# Patient Record
Sex: Female | Born: 1960 | Race: White | Hispanic: No | Marital: Married | Smoking: Never smoker
Health system: Southern US, Community
[De-identification: ages and names within clinical notes are randomized; demographics above are authoritative.]

## PROBLEM LIST (undated history)

## (undated) DIAGNOSIS — Z87442 Personal history of urinary calculi: Secondary | ICD-10-CM

## (undated) DIAGNOSIS — E079 Disorder of thyroid, unspecified: Secondary | ICD-10-CM

## (undated) DIAGNOSIS — K589 Irritable bowel syndrome without diarrhea: Secondary | ICD-10-CM

## (undated) DIAGNOSIS — C641 Malignant neoplasm of right kidney, except renal pelvis: Secondary | ICD-10-CM

## (undated) DIAGNOSIS — E559 Vitamin D deficiency, unspecified: Secondary | ICD-10-CM

## (undated) DIAGNOSIS — E041 Nontoxic single thyroid nodule: Secondary | ICD-10-CM

## (undated) DIAGNOSIS — E7800 Pure hypercholesterolemia, unspecified: Secondary | ICD-10-CM

## (undated) DIAGNOSIS — Z86010 Personal history of colonic polyps: Secondary | ICD-10-CM

## (undated) DIAGNOSIS — E039 Hypothyroidism, unspecified: Secondary | ICD-10-CM

## (undated) HISTORY — DX: Personal history of colonic polyps: Z86.010

## (undated) HISTORY — DX: Irritable bowel syndrome, unspecified: K58.9

## (undated) HISTORY — PX: ANKLE HARDWARE REMOVAL: SHX1149

## (undated) HISTORY — PX: LASIK: SHX215

## (undated) HISTORY — PX: COLONOSCOPY: SHX174

## (undated) HISTORY — DX: Disorder of thyroid, unspecified: E07.9

## (undated) HISTORY — PX: BIOPSY THYROID: PRO38

## (undated) HISTORY — PX: ANKLE FRACTURE SURGERY: SHX122

## (undated) HISTORY — DX: Nontoxic single thyroid nodule: E04.1

---
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
## 2013-06-01 DIAGNOSIS — Z86010 Personal history of colonic polyps: Secondary | ICD-10-CM

## 2013-06-01 DIAGNOSIS — Z860101 Personal history of adenomatous and serrated colon polyps: Secondary | ICD-10-CM

## 2013-06-01 HISTORY — DX: Personal history of colonic polyps: Z86.010

## 2013-06-01 HISTORY — DX: Personal history of adenomatous and serrated colon polyps: Z86.0101

## 2016-01-21 ENCOUNTER — Other Ambulatory Visit: Payer: Self-pay | Admitting: Physician Assistant

## 2016-01-21 ENCOUNTER — Inpatient Hospital Stay
Admission: RE | Admit: 2016-01-21 | Discharge: 2016-01-21 | Disposition: A | Discharge status: Home / Self Care | Payer: Self-pay | Source: Ambulatory Visit | Attending: Physician Assistant | Admitting: Physician Assistant

## 2016-01-21 DIAGNOSIS — E042 Nontoxic multinodular goiter: Secondary | ICD-10-CM

## 2016-01-27 ENCOUNTER — Inpatient Hospital Stay
Admission: RE | Admit: 2016-01-27 | Discharge: 2016-01-27 | Disposition: A | Discharge status: Home / Self Care | Payer: Self-pay | Source: Ambulatory Visit | Attending: Physician Assistant | Admitting: Physician Assistant

## 2016-01-27 ENCOUNTER — Other Ambulatory Visit: Payer: Self-pay | Admitting: Physician Assistant

## 2016-01-27 DIAGNOSIS — E042 Nontoxic multinodular goiter: Secondary | ICD-10-CM

## 2016-02-06 ENCOUNTER — Ambulatory Visit
Admission: RE | Admit: 2016-02-06 | Discharge: 2016-02-06 | Disposition: A | Discharge status: Home / Self Care | Payer: Managed Care, Other (non HMO) | Source: Ambulatory Visit | Attending: Physician Assistant | Admitting: Physician Assistant

## 2016-02-06 ENCOUNTER — Other Ambulatory Visit (HOSPITAL_COMMUNITY)
Admission: RE | Admit: 2016-02-06 | Discharge: 2016-02-06 | Disposition: A | Discharge status: Home / Self Care | Payer: Managed Care, Other (non HMO) | Source: Ambulatory Visit | Attending: Radiology | Admitting: Radiology

## 2016-02-06 DIAGNOSIS — E042 Nontoxic multinodular goiter: Secondary | ICD-10-CM | POA: Diagnosis present

## 2017-03-05 ENCOUNTER — Encounter: Payer: Self-pay | Admitting: Family Medicine

## 2017-03-05 ENCOUNTER — Ambulatory Visit (INDEPENDENT_AMBULATORY_CARE_PROVIDER_SITE_OTHER): Payer: Managed Care, Other (non HMO) | Admitting: Family Medicine

## 2017-03-05 VITALS — BP 130/82 | HR 79 | Temp 98.8°F | Resp 16 | Ht 67.5 in | Wt 185.6 lb

## 2017-03-05 DIAGNOSIS — Z13220 Encounter for screening for lipoid disorders: Secondary | ICD-10-CM | POA: Diagnosis not present

## 2017-03-05 DIAGNOSIS — E038 Other specified hypothyroidism: Secondary | ICD-10-CM | POA: Diagnosis not present

## 2017-03-05 DIAGNOSIS — E063 Autoimmune thyroiditis: Secondary | ICD-10-CM | POA: Diagnosis not present

## 2017-03-05 DIAGNOSIS — E041 Nontoxic single thyroid nodule: Secondary | ICD-10-CM | POA: Diagnosis not present

## 2017-03-05 DIAGNOSIS — Z1231 Encounter for screening mammogram for malignant neoplasm of breast: Secondary | ICD-10-CM | POA: Diagnosis not present

## 2017-03-05 DIAGNOSIS — E7800 Pure hypercholesterolemia, unspecified: Secondary | ICD-10-CM

## 2017-03-05 DIAGNOSIS — Z131 Encounter for screening for diabetes mellitus: Secondary | ICD-10-CM

## 2017-03-05 DIAGNOSIS — E559 Vitamin D deficiency, unspecified: Secondary | ICD-10-CM | POA: Diagnosis not present

## 2017-03-05 DIAGNOSIS — Z1239 Encounter for other screening for malignant neoplasm of breast: Secondary | ICD-10-CM

## 2017-03-05 MED ORDER — LEVOTHYROXINE SODIUM 25 MCG PO TABS: 25.0000 ug | ORAL_TABLET | Freq: Every day | ORAL | 3 refills | Status: DC

## 2017-03-05 NOTE — Patient Instructions (Addendum)
Iver Nestle, nutritionist at [facility] on [address]   We recommend that you schedule a mammogram for breast cancer screening. Typically, you do not need a referral to do this. Please contact a local imaging center to schedule your mammogram.  [facility] - [phone]  *ask for the Radiology Department [facility] ([place]) - [phone] or [phone]  [facility] - [phone] [place] [phone] [facility] - [phone]  *ask for the [facility] - [phone]  *ask for the Radiology Department [facility] - [phone]  *ask for the [place] - [phone] IF you received an x-ray today, you will receive an invoice from [facility]. Please contact [facility] at [phone] with questions or concerns regarding your invoice.   IF you received labwork today, you will receive an invoice from [place]. Please contact [facility] at [phone] with questions or concerns regarding your invoice.   Our billing staff will not be able to assist you with questions regarding bills from these companies.  You will be contacted with the lab results as soon as they are available. The fastest way to get your results is to activate your My Chart account. Instructions are located on the last page of this paperwork. If you have not heard from [place] regarding the results in 2 weeks, please contact this office.

## 2017-03-05 NOTE — Progress Notes (Signed)
Subjective:    Patient ID: Ann Miller, female    DOB: 04-May-1961, 56 y.o.   MRN: [id number]  03/05/2017  thyroid (new pt, moved from [place])   HPI This 56 y.o. female presents for evaluation of hypothyroidism and to establish care.    Last physical:  12-2015 Pap smear:12-2013; 04-16-16 LMP. Mammogram:  2017 Colonoscopy: not usre; five year plan; due 05/2018 Eye exam:  yearly Dental exam:  Every six months.  Hypothyroidism:  Patient reports good compliance with medication, good tolerance to medication, and good symptom control.  Started taking medication 2013.  Cold intoleranace, fatigue.  Malaise.    Thyroid nodule: s/p FNA last year.  Due for repeat ultrasound overdue.  Bone density scan performed. Vitamin D low in the past. Thyroid ab positive for years.    There is no immunization history on file for this patient. BP Readings from Last 3 Encounters:  03/05/17 130/82   Wt Readings from Last 3 Encounters:  03/05/17 185 lb 9.6 oz (84.2 kg)    Review of Systems  Constitutional: Negative for chills, diaphoresis, fatigue and fever.  Eyes: Negative for visual disturbance.  Respiratory: Negative for cough and shortness of breath.   Cardiovascular: Negative for chest pain, palpitations and leg swelling.  Gastrointestinal: Negative for abdominal pain, constipation, diarrhea, nausea and vomiting.  Endocrine: Negative for cold intolerance, heat intolerance, polydipsia, polyphagia and polyuria.  Neurological: Negative for dizziness, tremors, seizures, syncope, facial asymmetry, speech difficulty, weakness, light-headedness, numbness and headaches.    Past Medical History:  Diagnosis Date  . Thyroid disease    Past Surgical History:  Procedure Laterality Date  . ANKLE FRACTURE SURGERY Left    Allergies  Allergen Reactions  . Codeine    Current Outpatient Prescriptions  Medication Sig Dispense Refill  . levothyroxine (SYNTHROID, LEVOTHROID) 25 MCG tablet Take 25 mcg by  mouth daily before breakfast.     No current facility-administered medications for this visit.    Social History   Social History  . Marital status: Married    Spouse name: N/A  . Number of children: N/A  . Years of education: N/A   Occupational History  . Not on file.   Social History Main Topics  . Smoking status: Never Smoker  . Smokeless tobacco: Never Used  . Alcohol use Yes     Comment: 2 drinks a month  . Drug use: No  . Sexual activity: Not on file   Other Topics Concern  . Not on file   Social History Narrative   Marital status: married x 32 years; happily; no abuse; Moved from [place] in 2016      Children: (25, 23, 18); no grandchildren     Lives: with husband, youngest child.        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 Diabetes Mother   . Hyperlipidemia Mother   . Hyperlipidemia Father   . Diabetes Maternal Grandmother   . Diabetes Maternal Grandfather   . Cancer Paternal Grandmother   . Cancer Paternal Grandfather        Objective:    BP 130/82   Pulse 79   Temp 98.8 F (37.1 C) (Oral)   Resp 16   Ht 5' 7.5" (1.715 m)   Wt 185  lb 9.6 oz (84.2 kg)   LMP 04/16/2016 Comment: postmenopausal  SpO2 96%   BMI 28.64 kg/m  Physical Exam  Constitutional: She is oriented to person, place, and time. She appears well-developed and well-nourished. No distress.  HENT:  Head: Normocephalic and atraumatic.  Right Ear: External ear normal.  Left Ear: External ear normal.  Nose: Nose normal.  Mouth/Throat: Oropharynx is clear and moist.  Eyes: Conjunctivae and EOM are normal. Pupils are equal, round, and reactive to light.  Neck: Normal range of motion. Neck supple. Carotid bruit is not present. Thyromegaly present.  Cardiovascular: Normal rate, regular rhythm, normal heart sounds and  intact distal pulses.  Exam reveals no gallop and no friction rub.   No murmur heard. Pulmonary/Chest: Effort normal and breath sounds normal. She has no wheezes. She has no rales.  Abdominal: Soft. Bowel sounds are normal. She exhibits no distension and no mass. There is no tenderness. There is no rebound and no guarding.  Lymphadenopathy:    She has no cervical adenopathy.  Neurological: She is alert and oriented to person, place, and time. She has normal reflexes. No cranial nerve deficit. She exhibits normal muscle tone. Coordination normal.  Skin: Skin is warm and dry. No rash noted. She is not diaphoretic. No erythema. No pallor.  Psychiatric: She has a normal mood and affect. Her behavior is normal.   No results found for this or any previous visit.      Assessment & Plan:   1. Hypothyroidism due to Hashimoto's thyroiditis   2. Thyroid nodule   3. Vitamin D deficiency   4. Screening for diabetes mellitus   5. Screening, lipid    -patient established care today and medical history reviewed and updated. -obtain labs -refer for repeat thyroid US.  S/p biopsy of thyroid nodule with pathology consistent with benign follicular nodule. -refill of Levothyroxine provided today. -screening labs also obtained in anticipation of upcoming CPE.   Orders Placed This Encounter  Procedures  . US THYROID    Standing Status:   Future    Standing Expiration Date:   05/05/2018    Order Specific Question:   Reason for Exam (SYMPTOM  OR DIAGNOSIS REQUIRED)    Answer:   follow=up of R thyroid nodule at isthmus    Order Specific Question:   Preferred imaging location?    Answer:   [facility]  . CBC with Differential/Platelet  . VITAMIN D 25 Hydroxy (Vit-D Deficiency, Fractures)  . Comprehensive metabolic panel    Order Specific Question:   Has the patient fasted?    Answer:   Yes  . TSH  . T4, free  . Hemoglobin A1c  . Lipid panel    Order Specific Question:   Has the patient  fasted?    Answer:   Yes   Meds ordered this encounter  Medications  . levothyroxine (SYNTHROID, LEVOTHROID) 25 MCG tablet    Sig: Take 25 mcg by mouth daily before breakfast.    No Follow-up on file.   Xadrian Craighead Elayne Guerin, M.D. Primary Care at [facility] [address] [place], [place]  [address] [phone] phone [phone] fax

## 2017-03-06 LAB — LIPID PANEL
CHOL/HDL RATIO: 5.1 ratio — AB (ref 0.0–4.4)
CHOLESTEROL TOTAL: 215 mg/dL — AB (ref 100–199)
HDL: 42 mg/dL (ref >39)
LDL CALC: 149 mg/dL — AB (ref 0–99)
Triglycerides: 120 mg/dL (ref 0–149)
VLDL CHOLESTEROL CAL: 24 mg/dL (ref 5–40)

## 2017-03-06 LAB — CBC WITH DIFFERENTIAL/PLATELET
Basophils Absolute: 0 10*3/uL (ref 0.0–0.2)
Basos: 1 %
EOS (ABSOLUTE): 0.2 10*3/uL (ref 0.0–0.4)
Eos: 3 %
Hematocrit: 40.4 % (ref 34.0–46.6)
Hemoglobin: 13.6 g/dL (ref 11.1–15.9)
Immature Grans (Abs): 0 10*3/uL (ref 0.0–0.1)
Immature Granulocytes: 0 %
LYMPHS ABS: 1.9 10*3/uL (ref 0.7–3.1)
Lymphs: 35 %
MCH: 26.8 pg (ref 26.6–33.0)
MCHC: 33.7 g/dL (ref 31.5–35.7)
MCV: 80 fL (ref 79–97)
MONOS ABS: 0.5 10*3/uL (ref 0.1–0.9)
Monocytes: 8 %
Neutrophils Absolute: 2.9 10*3/uL (ref 1.4–7.0)
Neutrophils: 53 %
PLATELETS: 269 10*3/uL (ref 150–379)
RBC: 5.07 x10E6/uL (ref 3.77–5.28)
RDW: 13.9 % (ref 12.3–15.4)
WBC: 5.5 10*3/uL (ref 3.4–10.8)

## 2017-03-06 LAB — COMPREHENSIVE METABOLIC PANEL
ALK PHOS: 112 IU/L (ref 39–117)
ALT: 15 IU/L (ref 0–32)
AST: 20 IU/L (ref 0–40)
Albumin/Globulin Ratio: 1.7 (ref 1.2–2.2)
Albumin: 4.4 g/dL (ref 3.5–5.5)
BILIRUBIN TOTAL: 0.7 mg/dL (ref 0.0–1.2)
BUN / CREAT RATIO: 13 (ref 9–23)
BUN: 11 mg/dL (ref 6–24)
CHLORIDE: 103 mmol/L (ref 96–106)
CO2: 24 mmol/L (ref 18–29)
CREATININE: 0.84 mg/dL (ref 0.57–1.00)
Calcium: 9.7 mg/dL (ref 8.7–10.2)
GFR calc Af Amer: 91 mL/min/{1.73_m2} (ref >59)
GFR calc non Af Amer: 79 mL/min/{1.73_m2} (ref >59)
GLOBULIN, TOTAL: 2.6 g/dL (ref 1.5–4.5)
Glucose: 92 mg/dL (ref 65–99)
POTASSIUM: 4.2 mmol/L (ref 3.5–5.2)
SODIUM: 141 mmol/L (ref 134–144)
Total Protein: 7 g/dL (ref 6.0–8.5)

## 2017-03-06 LAB — TSH: TSH: 3.19 u[IU]/mL (ref 0.450–4.500)

## 2017-03-06 LAB — HEMOGLOBIN A1C
Est. average glucose Bld gHb Est-mCnc: 111 mg/dL
Hgb A1c MFr Bld: 5.5 % (ref 4.8–5.6)

## 2017-03-06 LAB — VITAMIN D 25 HYDROXY (VIT D DEFICIENCY, FRACTURES): Vit D, 25-Hydroxy: 31.7 ng/mL (ref 30.0–100.0)

## 2017-03-06 LAB — T4, FREE: FREE T4: 1.13 ng/dL (ref 0.82–1.77)

## 2017-03-09 ENCOUNTER — Ambulatory Visit (INDEPENDENT_AMBULATORY_CARE_PROVIDER_SITE_OTHER): Payer: Managed Care, Other (non HMO) | Admitting: Physician Assistant

## 2017-03-09 ENCOUNTER — Encounter: Payer: Self-pay | Admitting: Family Medicine

## 2017-03-09 ENCOUNTER — Encounter: Payer: Self-pay | Admitting: Physician Assistant

## 2017-03-09 VITALS — BP 134/81 | HR 74 | Temp 98.1°F | Resp 18 | Ht 67.44 in | Wt 188.2 lb

## 2017-03-09 DIAGNOSIS — B309 Viral conjunctivitis, unspecified: Secondary | ICD-10-CM | POA: Diagnosis not present

## 2017-03-09 MED ORDER — NAPHAZOLINE-PHENIRAMINE 0.025-0.3 % OP SOLN: 1.0000 [drp] | Freq: Four times a day (QID) | OPHTHALMIC | 0 refills | Status: DC | PRN

## 2017-03-09 NOTE — Patient Instructions (Addendum)
Use eye drops as prescribed. This will help with the redness and the itching. Please also use a warm compress to the eye throughout the day. Call our office or return to clinic if symptoms worsen (i.e., you start to have worsening vision, develop any pain, or purulent discharge), do not improve in 5- 7 days, or as needed    Viral Conjunctivitis, Adult Viral conjunctivitis is an inflammation of the clear membrane that covers the white part of your eye and the inner surface of your eyelid (conjunctiva). The inflammation is caused by a viral infection. The blood vessels in the conjunctiva become inflamed, causing the eye to become red or pink, and often itchy. Viral conjunctivitis can be easily passed from one person to another (is contagious). This condition is often called pink eye. What are the causes? This condition is caused by a virus. A virus is a type of contagious germ. It can be spread by touching objects that have been contaminated with the virus, such as doorknobs or towels. It can also be passed through droplets, such as from coughing or sneezing. What are the signs or symptoms? Symptoms of this condition include:  Eye redness.  Tearing or watery eyes.  Itchy and irritated eyes.  Burning feeling in the eyes.  Clear drainage from the eye.  Swollen eyelids.  A gritty feeling in the eye.  Light sensitivity.  This condition often occurs with other symptoms, such as a fever, nausea, or a rash. How is this diagnosed? This condition is diagnosed with a medical history and physical exam. If you have discharge from your eye, the discharge may be tested to rule out other causes of conjunctivitis. How is this treated? Viral conjunctivitis does not respond to medicines that kill bacteria (antibiotics). Treatment for viral conjunctivitis is directed at stopping a bacterial infection from developing in addition to the viral infection. Treatment also aims to relieve your symptoms, such as  itching. This may be done with antihistamine drops or other eye medicines. Rarely, steroid eye drops or antiviral medicines may be prescribed. Follow these instructions at home: Medicines   Take or apply over-the-counter and prescription medicines only as told by your health care provider.  Be very careful to avoid touching the edge of the eyelid with the eye drop bottle or ointment tube when applying medicines to the affected eye. Being careful this way will stop you from spreading the infection to the other eye or to other people. Eye care  Avoid touching or rubbing your eyes.  Apply a warm, wet, clean washcloth to your eye for 10-20 minutes, 3-4 times per day or as told by your health care provider.  If you wear contact lenses, do not wear them until the inflammation is gone and your health care provider says it is safe to wear them again. Ask your health care provider how to sterilize or replace your contact lenses before using them again. Wear glasses until you can resume wearing contacts.  Avoid wearing eye makeup until the inflammation is gone. Throw away any old eye cosmetics that may be contaminated.  Gently wipe away any drainage from your eye with a warm, wet washcloth or a cotton ball. General instructions  Change or wash your pillowcase every day or as told by your health care provider.  Do not share towels, pillowcases, washcloths, eye makeup, makeup brushes, contact lenses, or glasses. This may spread the infection.  Wash your hands often with soap and water. Use paper towels to dry your  hands. If soap and water are not available, use hand sanitizer.  Try to avoid contact with other people for one week or as told by your health care provider. Contact a health care provider if:  Your symptoms do not improve with treatment or they get worse.  You have increased pain.  Your vision becomes blurry.  You have a fever.  You have facial pain, redness, or swelling.  You  have yellow or green drainage coming from your eye.  You have new symptoms. This information is not intended to replace advice given to you by your health care provider. Make sure you discuss any questions you have with your health care provider. Document Released: 12/05/2002 Document Revised: 04/11/2016 Document Reviewed: 03/31/2016 Elsevier Interactive Patient Education  2017 [employer].  IF you received an x-ray today, you will receive an invoice from [facility]. Please contact [facility] at [phone] with questions or concerns regarding your invoice.   IF you received labwork today, you will receive an invoice from [place]. Please contact [facility] at [phone] with questions or concerns regarding your invoice.   Our billing staff will not be able to assist you with questions regarding bills from these companies.  You will be contacted with the lab results as soon as they are available. The fastest way to get your results is to activate your My Chart account. Instructions are located on the last page of this paperwork. If you have not heard from [place] regarding the results in 2 weeks, please contact this office.     We recommend that you schedule a mammogram for breast cancer screening. Typically, you do not need a referral to do this. Please contact a local imaging center to schedule your mammogram.  [facility] - [phone]  *ask for the Radiology Department [facility] ([place]) - [phone] or [phone]  [facility] - [phone] [place] [phone] [facility] - [phone]  *ask for the [facility] - [phone]  *ask for the Radiology Department [facility] - [phone]  *ask for the [place] - [phone]

## 2017-03-09 NOTE — Progress Notes (Addendum)
Ann Miller  MRN: [id number] DOB: 09-11-1961  Subjective:  Ann Miller is a 56 y.o. female seen in office today for a chief complaint of left itchy eye x 1 day. Has associated redness and watery discharge. Denies eye pain, photophobia, foreign body sensation, visual disturbance, and recent illness. Denies contact lens use. Has not tried anything for relief. Has no hx of seasonal allergies.   Review of Systems  Constitutional: Negative for chills, fatigue and fever.  HENT: Negative for congestion, ear pain, rhinorrhea, sinus pain, sneezing and sore throat.     There are no active problems to display for this patient.   Current Outpatient Prescriptions on File Prior to Visit  Medication Sig Dispense Refill  . levothyroxine (SYNTHROID, LEVOTHROID) 25 MCG tablet Take 1 tablet (25 mcg total) by mouth daily before breakfast. 90 tablet 3   No current facility-administered medications on file prior to visit.     Allergies  Allergen Reactions  . Codeine      Objective:  BP 134/81 (BP Location: Right Arm, Patient Position: Sitting, Cuff Size: Normal)   Pulse 74   Temp 98.1 F (36.7 C) (Oral)   Resp 18   Ht 5' 7.44" (1.713 m)   Wt 188 lb 3.2 oz (85.4 kg)   LMP 04/16/2016 Comment: postmenopausal  SpO2 98%   BMI 29.09 kg/m   Visual Acuity Screening   Right eye Left eye Both eyes  Without correction: 20/30 20/30 20/13   With correction:       Physical Exam  Constitutional: She is oriented to person, place, and time and well-developed, well-nourished, and in no distress.  HENT:  Head: Normocephalic and atraumatic.  Right Ear: Tympanic membrane, external ear and ear canal normal.  Left Ear: Tympanic membrane, external ear and ear canal normal.  Nose: Nose normal. Right sinus exhibits no maxillary sinus tenderness and no frontal sinus tenderness. Left sinus exhibits no maxillary sinus tenderness and no frontal sinus tenderness.  Mouth/Throat: Uvula is midline, oropharynx is  clear and moist and mucous membranes are normal.  Eyes: Left eye visual fields normal. EOM are normal. Pupils are equal, round, and reactive to light. Left eye exhibits discharge (moderate amount of watery discharge). Left eye exhibits no hordeolum. Left conjunctiva is injected (mildly).  Fundoscopic exam:      The right eye shows no AV nicking, no exudate and no papilledema. The right eye shows red reflex.       The left eye shows no AV nicking, no exudate and no papilledema. The left eye shows red reflex.  No pain with palpation of the orbits bilaterally. No pain with penlight examination.   Neck: Normal range of motion.  Pulmonary/Chest: Effort normal.  Lymphadenopathy:       Head (right side): No submental, no submandibular, no tonsillar, no preauricular, no posterior auricular and no occipital adenopathy present.       Head (left side): No submental, no submandibular, no tonsillar, no preauricular and no occipital adenopathy present.    She has no cervical adenopathy.       Right: No supraclavicular adenopathy present.       Left: No supraclavicular adenopathy present.  Neurological: She is alert and oriented to person, place, and time. Gait normal.  Skin: Skin is warm and dry.  Psychiatric: Affect normal.  Vitals reviewed.   Assessment and Plan :  1. Viral conjunctivitis of left eye Hx of PE findings consistent with viral conjunctivitis. No red flags noted. Will treat symptomatically. Pt  instructed to return to clinic if symptoms worsen, do not improve in 5-7 days, or as needed - naphazoline-pheniramine (NAPHCON-A) 0.025-0.3 % ophthalmic solution; Place 1 drop into the left eye 4 (four) times daily as needed for irritation.  Dispense: 15 mL; Refill: 0   Tenna Delaine, PA-C  Primary Care at [facility] 03/09/2017 1:48 PM

## 2017-03-22 ENCOUNTER — Ambulatory Visit
Admission: RE | Admit: 2017-03-22 | Discharge: 2017-03-22 | Disposition: A | Discharge status: Home / Self Care | Payer: Managed Care, Other (non HMO) | Source: Ambulatory Visit | Attending: Family Medicine | Admitting: Family Medicine

## 2017-03-22 DIAGNOSIS — E063 Autoimmune thyroiditis: Principal | ICD-10-CM

## 2017-03-22 DIAGNOSIS — E038 Other specified hypothyroidism: Secondary | ICD-10-CM

## 2017-03-22 DIAGNOSIS — E041 Nontoxic single thyroid nodule: Secondary | ICD-10-CM

## 2017-03-22 MED ORDER — LEVOTHYROXINE SODIUM 50 MCG PO TABS: 50.0000 ug | ORAL_TABLET | Freq: Every day | ORAL | 3 refills | Status: DC

## 2017-03-22 NOTE — Addendum Note (Signed)
Addended by: Wardell Honour on: 03/22/2017 11:23 AM   Modules accepted: Orders

## 2017-03-25 ENCOUNTER — Ambulatory Visit
Admission: RE | Admit: 2017-03-25 | Discharge: 2017-03-25 | Disposition: A | Discharge status: Home / Self Care | Payer: Managed Care, Other (non HMO) | Source: Ambulatory Visit | Attending: Family Medicine | Admitting: Family Medicine

## 2017-03-25 DIAGNOSIS — Z1239 Encounter for other screening for malignant neoplasm of breast: Secondary | ICD-10-CM

## 2017-03-27 ENCOUNTER — Other Ambulatory Visit: Payer: Self-pay | Admitting: Family Medicine

## 2017-03-27 DIAGNOSIS — E042 Nontoxic multinodular goiter: Secondary | ICD-10-CM

## 2017-03-29 ENCOUNTER — Telehealth: Payer: Self-pay | Admitting: Family Medicine

## 2017-03-29 DIAGNOSIS — E041 Nontoxic single thyroid nodule: Secondary | ICD-10-CM

## 2017-03-29 NOTE — Telephone Encounter (Signed)
[facility] called to request a second duplicate US Thyroid biopsy order be placed since they are looking at both nodules on pt.

## 2017-03-30 NOTE — Telephone Encounter (Signed)
Please place orders

## 2017-04-05 NOTE — Telephone Encounter (Signed)
[facility] advised

## 2017-04-05 NOTE — Telephone Encounter (Signed)
Completed/order placed.

## 2017-04-22 ENCOUNTER — Encounter: Payer: Self-pay | Admitting: Family Medicine

## 2017-04-22 ENCOUNTER — Ambulatory Visit (INDEPENDENT_AMBULATORY_CARE_PROVIDER_SITE_OTHER): Payer: Managed Care, Other (non HMO) | Admitting: Family Medicine

## 2017-04-22 DIAGNOSIS — Z713 Dietary counseling and surveillance: Secondary | ICD-10-CM | POA: Diagnosis not present

## 2017-04-22 NOTE — Patient Instructions (Addendum)
-   Keep a symptoms log, including what you eat, how much, and what time, as well as symptoms and time of onset.  IF you cannot determine any pattern or specific foods that cause problems, you may want to try a FODMAPS elimination diet to try to pinpoint triggering foods.  We can talk about that at follow-up, if you choose.   - VSL#3 is a probiotic that has a lot of good data behind it for use with IBS.  ([facility] at [facility] and at [facility]; call ahead.)  - Soy is the only dairy alternative that provides decent protein.   - You will get better absorption of your calcium if you split up your Viactiv chews to twice a day vs. taking all at once.    Goals: - Eat at least 3 REAL meals and 1-2 snacks per day.  Aim for no more than 5 hours between eating.  Eat breakfast within one hour of getting up.  Consider what foods you may be able to tolerate in small quantities for breakfast, e.g., fruit or handful of nuts.   A REAL meal includes at least some protein, some starch, and vegetables and/or fruit.    - Lower glycemic foods (don't raise blood glucose as much) are those that are NOT highly processed, e.g., cooked whole grains vs. Ready to eat cereal or bread OR foods that nature prepared (exception: white potato is high-glycemic.)   - [place].com includes a lot of info on glycemic index and glycemic load.   - Obtain twice as many veg's as protein or carbohydrate foods for both lunch and dinner.  - Be cautious about what vegetables you use, and be careful with those that cause problems.    - AT YOUR FOLLOW-UP VISIT, WE CAN TALK ABOUT WAYS TO MANAGE CHOLESTEROL LEVELS AND BLOOD GLUCOSE.  And we'll follow up on probiotic use, and dietary ways you may be able to enhance your gut microbes.

## 2017-04-22 NOTE — Progress Notes (Signed)
Medical Nutrition Therapy:  Appt start time: 1330 end time:  1430. PCP: Reginia Forts, MD  Assessment:  Primary concerns today: Weight management.  Evynn was referred by Joycelyn Rua for preventive nutrition counseling.  She moved here from [place] 2 yrs ago, and has not been working since then, so now has time to devote to better self-care.  She and her husband have an 18-YO at home, who will be going to [employer] in the fall, and 2 older children not living at home.  She wants to understand the best way for her to eat in light of menopause, cholesterol level rising recently, and family hx of DM.  Lab's on 03/05/17: Cholest 215; LDL 149; TG 120; HDL 42.   She is frustrated that she feels she's been eating well and exercising more, but not losing weight nor improving lipids.  Would love to weigh less than 150.  Yittel feels especially drawn to sweets in the evenings, usually has a dessert food 4-5 X wk.  When stressed she tends to not eat, and can even lose weight during stressful times.    Stefhanie has had digestive problems most of her adult life (IBS dx years ago), including frequent diarrhea for which she started taking a probiotic called Probiotic 12 by [employer] last March.    I suspect Caroleen is actually under-eating, both energy and protein, in light of her current eating and exercise patterns.  She tends to eat mostly vegetarian, and avoids most dairy b/c of digestive problems.    Learning Readiness: Ready  Usual eating pattern includes 2-3 meals and 2 snacks per day. Frequent foods and beverages include water, Shakeology pro powder, leafy greens, 8 oz almond milk, 1/2 banana, ice; leafy greens, Hello Fresh Meals, Blue Apron, Purple Carrot (vegan) prepared meals; garbanzos, quinoa, swt potatoes, fruit, green/black tea.  Avoided foods include most soda, etOH, chips, fast foods, cereal.  Has been eating mostly vegetarian meals plus fish and eggs.   Usual physical activity includes 60-min yoga 2-3 X wk,  60-min pilates 2-3 X wk, 45-min [place] [employer]em[employer] in [employer]) 2-3 X wk.  Usually exercises 5-6 X wk.    24-hr recall: (Up at 5:30; up at 9 AM; levothyroxine first thing, probiotic 1 hr later) B ( AM)-   Few sips water Snk ( AM)-   Few sips water L (11:30 PM)-  Shakeology pro powder, leafy greens, 8 oz almond milk, 1/2 banana, ice Snk (4 PM)-  2/3 c cashews, water D (8 PM)-  McD's 6 chx nuggets, med fries, 12 oz soda Snk (9 PM)-  1 freeze pop Typical day? No.  Seldom has fast food or soda. Dinner is usually a little earlier, but no one was home yesterday.    Progress Towards Goal(s):  In progress.   Nutritional Diagnosis:  NI-1.4 Inadequate energy intake As related to meals, in light of usual energy expenditure.  As evidenced by eating pattern of only 2 meals/day with tendency to snack in evenings on foods she would like to be avoiding.    Intervention:  Nutrition education  Handouts given during visit include:  AVS  Demonstrated degree of understanding via:  Teach Back   Barriers to learning/adherence to lifestyle change: attraction to sweets  Monitoring/Evaluation:  Dietary intake, exercise, and body weight in 4 week(s).

## 2017-04-28 ENCOUNTER — Ambulatory Visit
Admission: RE | Admit: 2017-04-28 | Discharge: 2017-04-28 | Disposition: A | Discharge status: Home / Self Care | Payer: Managed Care, Other (non HMO) | Source: Ambulatory Visit | Attending: Family Medicine | Admitting: Family Medicine

## 2017-04-28 ENCOUNTER — Other Ambulatory Visit (HOSPITAL_COMMUNITY)
Admission: RE | Admit: 2017-04-28 | Discharge: 2017-04-28 | Disposition: A | Discharge status: Home / Self Care | Payer: Managed Care, Other (non HMO) | Source: Ambulatory Visit | Attending: General Surgery | Admitting: General Surgery

## 2017-04-28 DIAGNOSIS — E041 Nontoxic single thyroid nodule: Secondary | ICD-10-CM | POA: Insufficient documentation

## 2017-04-28 DIAGNOSIS — E042 Nontoxic multinodular goiter: Secondary | ICD-10-CM

## 2017-05-20 ENCOUNTER — Encounter: Payer: Self-pay | Admitting: Family Medicine

## 2017-05-20 ENCOUNTER — Ambulatory Visit (INDEPENDENT_AMBULATORY_CARE_PROVIDER_SITE_OTHER): Payer: Managed Care, Other (non HMO) | Admitting: Family Medicine

## 2017-05-20 DIAGNOSIS — Z713 Dietary counseling and surveillance: Secondary | ICD-10-CM

## 2017-05-20 NOTE — Patient Instructions (Addendum)
-   Protein recommended intake:  0.8 g per kg of body weight.  This equals 68 grams per day.  Recent research suggests that our protein needs increase some as we age.  A good functional number is 70 grams.  You will be able to meet this goal with obtaining 20 grams of protein at each meal.    - Keep in mind that the classic vegetarian protein source is BEANS (If you are using beans as your sole protein source, you need at least a FULL cup).  Add to salads; make bean burritos or tacos; soups/casseroles.   - B12: 2.4 mcg/day.  - Iodine: 150 mcg; talk to Dr. Tamala Julian about your iodine concerns.    - Pay attn to your sweets cravings:  What precedes your choice of eating something sweet?  (Hint: Consistently eating 3 balanced meals helps prevent sweets cravings.)  - Do am analysis of what led to that choice.  What can you put in place for that same choice to NOT happen next time?  What role did HABIT play in this choice?  - Disconnect sweets from any sense of reward.    - Limit sweets to those times you can give your snack complete attention, and savor every bite.    - Track the number of times you have something sweet.  Include: Were you distracted while eating your sweet food?  Good information about your nutrient needs:  [email or web address]

## 2017-05-20 NOTE — Progress Notes (Signed)
Medical Nutrition Therapy:  Appt start time: 1330 end time:  1430. PCP: Reginia Forts, MD  Assessment:  Primary concerns today: Weight management.  Danese has increased her protein intake, especially at breakfast.  This has been challenging for her.  She has started getting some yogurt 3-4 X week.  Her preference is still to mostly avoid meat.    She remains frustrated that she is not losing weight, and Mikhayla had numerous questions about specific nutrient needs, (e.g., iodine needs, as related to thryroid nodules?).    Ezrie has realized that cravings for sweets are pretty frequent, and that although she is not an emotional eater, she does tend to eat sweets at times of positive emotions, such as around friends and families and celebrations.  Estimates she eats sweets at least daily.  This may be impacting her weight loss ability.    Recent physical activity includes 60-min yoga 2-3 X wk, 60-min pilates 2-3 X wk, 45-min [place] (Females in [employer]) 0-2 X wk.  Usually exercises 5 X wk.    24-hr recall:  (Up at 7:30 AM; took levothyroxine) B (9 AM)-  2 eggs, olive oil, 2 toast, 1 tbsp alm butter, 2 tsp str'berry jam, water Snk ( AM)-  --- L (2:45 PM)-  3/4 c carrots, few fries, water Snk (5 PM)-  2 tbsp cashews D (8 PM)-  1/2 tortilla, 1/3 c blk beans, 1/2 oz chs, pep's, tom, water Snk (9 PM)-  1/2 brownie, water Typical day? No. Didn't eat as much as usual.  Lunch is often vegetarian dinner leftovers or skinny bagel with lox or salad with a protein source.  Estimates she gets a real lunch 4 X wk.    Progress Towards Goal(s):  In progress.   Nutritional Diagnosis: Some progress noted on NI-1.4 Inadequate energy intake As related to meals, in light of usual energy expenditure.  As evidenced by 3 meals a day at least half the days of the week, and increased protein intake, especially at breakfast.    Intervention:  Nutrition education  Handouts given during visit  include:  AVS  Demonstrated degree of understanding via:  Teach Back   Barriers to learning/adherence to lifestyle change: attraction to sweets  Monitoring/Evaluation:  Dietary intake, exercise, and body weight in 10 week(s).

## 2017-08-02 ENCOUNTER — Encounter: Payer: Self-pay | Admitting: Family Medicine

## 2017-08-02 ENCOUNTER — Ambulatory Visit (INDEPENDENT_AMBULATORY_CARE_PROVIDER_SITE_OTHER): Payer: Managed Care, Other (non HMO) | Admitting: Family Medicine

## 2017-08-02 DIAGNOSIS — Z713 Dietary counseling and surveillance: Secondary | ICD-10-CM | POA: Diagnosis not present

## 2017-08-02 NOTE — Progress Notes (Signed)
Medical Nutrition Therapy:  Appt start time: 1330 end time:  1430. PCP: Reginia Forts, MD  Assessment:  Primary concerns today: Weight management.  Jenet continues to have intermittent problems with digestion.  Foods that she knows she doesn't tolerate well include soda, especially colas; leafy greens or roasted veg's in excess; milk (can tolerate yogurt ok); cream sauces.    Landis has been eating earlier in the day, and is eating more protein at each meal, but consistency is still questionable, i.e., a typical lunch is a large salad with no protein added.  Kemper still identifies sweets as an area she feels needs work; estimates she eats some dessert-type food at least once a day on average.    Recent physical activity includes 60-min yoga 1-2 X wk, 60-min pilates 1-2 X wk, 45-min [place] ([employer]emales [employer]) 1-2 X wk.  Usually exercises 6 X wk; walks 1-2 times if can't get at least 5 classes in the week.    24-hr recall:  (Up at 9 AM; took levothyroxine) B (10:30 AM)-  5 oz [employer] Grk yogurt (15 g protein, 120 kcal), water Snk ( AM)-  --- L (12 PM)-  1/2 [employer] [place] apple fritter, 2 scrmbld eggs, water Snk (6 PM)-  water D ( PM)-  5 oz salmon, tossed salad, 1 tbsp drsng, 2 small slc bread, 1 tsp butter, water Snk ( PM)-  --- Typical day? No.  Doesn't usually have apple fritter for lunch.  More typical lunch is big salad w/ slc of bread, followed by aft snack of apple with almond butter.    Progress Towards Goal(s):  In progress.   Nutritional Diagnosis: Some progress noted on NI-1.4 Inadequate energy intake As related to meals, in light of usual energy expenditure.  As evidenced by 3 meals a day at least half the days of the week, and increased protein intake, especially at breakfast.    Intervention:  Nutrition education  Handouts given during visit include:  AVS  Demonstrated degree of understanding via:  Teach Back   Barriers to learning/adherence to lifestyle change:  attraction to sweets  Monitoring/Evaluation:  Dietary intake, exercise, and body weight in 10 week(s).  Insurance will not cover more visits until 2019.

## 2017-08-02 NOTE — Patient Instructions (Addendum)
-   You may want to consider trying digestive enzymes.  I recommend getting a product that targets all macronutrients, carb, protein, and fat.  Natural Alternatives is a reliable source.    - Consider cooking some lean red meat up to a couple times a week, then slicing it and freezing for sandwiches or for salads.  (Plan to cook leftover fish for salads.)  - DASH Diet: Main feature is LOTS of fruits and veg's, but it also includes (consistently) lean meats and protein sources, including lean dairy foods.    - Quick decoding Qs:   - When you are struggling with a food decision, ask yourself these three "decoding" questions: 1. What am I feeling right now? 2. What do I want to feel? 3. What do I truly need right now? Use the Feelings and Needs lists, and WRITE your answers.  After each question, ask yourself, "Is there anything more?" You are looking for feelings, not thoughts.  Bring your responses to your follow-up appt.    Goals: 1. Limit sweets to 4 times a week.    a. How do you define "a sweet"?  = your idea of a "normal" portion.   b. Recognize that the overall goal is to decrease intake of sugar (and empty calories).   c. Use the "quick decoding Qs" 2. Get at least 45 minutes of exercise 5 X wk.  (Record # of minutes each day.) 3. Consistency with your probiotic and any other supplements.  Limit sweets to 4 times a week.     - Complete Goals Sheet, and bring to appt in January.   - Email Jeannie no later than mid-December with a progress report.

## 2017-10-18 ENCOUNTER — Ambulatory Visit: Payer: Managed Care, Other (non HMO) | Admitting: Family Medicine

## 2017-10-25 ENCOUNTER — Ambulatory Visit (INDEPENDENT_AMBULATORY_CARE_PROVIDER_SITE_OTHER): Payer: Managed Care, Other (non HMO) | Admitting: Family Medicine

## 2017-10-25 ENCOUNTER — Encounter: Payer: Self-pay | Admitting: Family Medicine

## 2017-10-25 DIAGNOSIS — Z713 Dietary counseling and surveillance: Secondary | ICD-10-CM

## 2017-10-25 NOTE — Progress Notes (Signed)
Medical Nutrition Therapy:  Appt start time: 1330 end time:  1430. PCP: Reginia Forts, MD  Assessment:  Primary concerns today: Weight management.  Britlee has been tracking exercise and food goals on her Goals Sheet.  She has been very consistent with her supplements, and has met her sweets goal most weeks.  She has noticed it is much harder to limit sweets when away or at a party when sweets are readily available.  She has also found that eating with full attention, and practicing mindfulness has been helpful.  Chene doesn't think her cravings for sweets are emotionally related, but are more often associated with social events, needing a mental pick-me-up, or feeling bored.    Eating pattern was more consistent before the holidays, which seemed helpful to both better digestion and to limiting sweets.  Sometimes gets only two meals a day.  We discussed the benefits of consistency in meal times for overall nutrition and weight management today.    Recent physical activity still includes 60-min yoga 1-2 X wk, 60-min pilates 1-2 X wk, 45-min [place] (Females [employer]) 1-2 X wk.  Usually exercises 5 X wk; walks 1-2 times if can't get at least 5 classes in the week.  Tomia has been having some elbow pain, which has been somewhat problematic in yoga.    24-hr recall:  (Up at 9 AM) B (10 AM)-  1 c veg soup, water Snk ( AM)-  --- L ( PM)-  --- Snk (4 PM)-  1 c hot chocolate Snk ( PM)-  --- D (6 PM)-  1 roast beef & cheese sandwich, 1 c potato chips, 1/2 pomegranite, water Snk ( PM)-  --- Typical day? No.  Usually eating earlier bkfst; aft snack is usually yogurt or apple with almond butter; usually larger dinner; missing lunch ~2-3 days/wk.    Progress Towards Goal(s):  In progress.   Nutritional Diagnosis: Stable progress noted on NI-1.4 Inadequate energy intake As related to meals, in light of usual energy expenditure.  As evidenced by 3 meals a day at least half the days of the week, and  increased protein intake, especially at breakfast.    Intervention:  Nutrition education  Handouts given during visit include:  AVS  Goals Sheet, revised  Demonstrated degree of understanding via:  Teach Back   Barriers to learning/adherence to lifestyle change: attraction to sweets  Monitoring/Evaluation:  Dietary intake, exercise, and body weight prn.  Patient will call for appt after annual physical in April.

## 2017-10-25 NOTE — Patient Instructions (Addendum)
-   If your elbows continue to have pain, consider seeing someone at [place] or Charlann Boxer at [facility].    Goals: 1. Limit sweets to 4 times a week.    a. How do you define "a sweet"?  = your idea of a "normal" portion.   b. Recognize that the overall goal is to decrease intake of sugar (and empty calories).   c. Use the "quick decoding Qs" 2. Get at least 45 minutes of exercise 5 X wk.  (Record # of minutes each day.) 3. Eat at consistent meal times (roughly within one hour of getting up; lunch no later than 2 PM; dinner no later than 8; afternoon snack).  Being more consistent on your eating pattern (at least 3 meals and 1 snack/day) as well as intake of vegetables (and any fiber sources) should help your GI function.  Set your phone alarm for 1 hr after levothyroxine!   Complete your Goals Sheet, and bring to follow-up appt.    Call or email for appt in April (after your physical): [phone] / [email or web address]. (And let me know what you decide to do re. getting a bone scan.)

## 2018-01-19 ENCOUNTER — Encounter: Payer: Managed Care, Other (non HMO) | Admitting: Family Medicine

## 2018-01-24 ENCOUNTER — Encounter: Payer: Self-pay | Admitting: Family Medicine

## 2018-01-24 ENCOUNTER — Other Ambulatory Visit: Payer: Self-pay

## 2018-01-24 ENCOUNTER — Ambulatory Visit (INDEPENDENT_AMBULATORY_CARE_PROVIDER_SITE_OTHER): Payer: Managed Care, Other (non HMO) | Admitting: Family Medicine

## 2018-01-24 VITALS — BP 132/72 | HR 78 | Temp 98.4°F | Resp 16 | Ht 67.5 in | Wt 172.0 lb

## 2018-01-24 DIAGNOSIS — Z136 Encounter for screening for cardiovascular disorders: Secondary | ICD-10-CM | POA: Diagnosis not present

## 2018-01-24 DIAGNOSIS — Z1159 Encounter for screening for other viral diseases: Secondary | ICD-10-CM | POA: Diagnosis not present

## 2018-01-24 DIAGNOSIS — Z131 Encounter for screening for diabetes mellitus: Secondary | ICD-10-CM

## 2018-01-24 DIAGNOSIS — Z0000 Encounter for general adult medical examination without abnormal findings: Secondary | ICD-10-CM

## 2018-01-24 DIAGNOSIS — E7800 Pure hypercholesterolemia, unspecified: Secondary | ICD-10-CM | POA: Diagnosis not present

## 2018-01-24 DIAGNOSIS — Z1231 Encounter for screening mammogram for malignant neoplasm of breast: Secondary | ICD-10-CM

## 2018-01-24 DIAGNOSIS — E042 Nontoxic multinodular goiter: Secondary | ICD-10-CM | POA: Diagnosis not present

## 2018-01-24 DIAGNOSIS — Z860100 Personal history of colon polyps, unspecified: Secondary | ICD-10-CM

## 2018-01-24 DIAGNOSIS — Z6826 Body mass index (BMI) 26.0-26.9, adult: Secondary | ICD-10-CM | POA: Diagnosis not present

## 2018-01-24 DIAGNOSIS — E559 Vitamin D deficiency, unspecified: Secondary | ICD-10-CM

## 2018-01-24 DIAGNOSIS — Z124 Encounter for screening for malignant neoplasm of cervix: Secondary | ICD-10-CM | POA: Diagnosis not present

## 2018-01-24 DIAGNOSIS — Z1239 Encounter for other screening for malignant neoplasm of breast: Secondary | ICD-10-CM

## 2018-01-24 DIAGNOSIS — Z114 Encounter for screening for human immunodeficiency virus [HIV]: Secondary | ICD-10-CM | POA: Diagnosis not present

## 2018-01-24 NOTE — Patient Instructions (Addendum)
Recommend weight loss, exercise for 30-60 minutes five days per week; recommend 1200 kcal restriction per day with a minimum of 60 grams of protein per day.  Eat 3 meals per day. Do not skip meals. Consider having a protein shake as a meal replacement to aid with eliminating meal skipping. Look for products with <220 calories, <7 gm sugar, and 20-30 gm protein.  Eat breakfast within 2 hours of getting up.   Make  your plate non-starchy vegetables,  protein, and  carbohydrates at lunch and dinner.   Aim for at least 64 oz. of calorie-free beverages daily (water, Crystal Light, diet green tea, etc.). Eliminate any sugary beverages such as regular soda, sweet tea, or fruit juice.   Pay attention to hunger and fullness cues.  Stop eating once you feel satisfied; don't wait until you feel full, stuffed, or sick from eating.  Choose lean meats and low fat/fat free dairy products.  Choose foods high in fiber such as fruits, vegetables, and whole grains (brown rice, whole wheat pasta, whole wheat bread, etc.).  Limit foods with added sugar to <7 gm per serving.  Always eat in the kitchen/dining room.  Never eat in the bedroom or in front of the TV.   [email or web address]  IF you received an x-ray today, you will receive an invoice from [facility]. Please contact [facility] at [phone] with questions or concerns regarding your invoice.   IF you received labwork today, you will receive an invoice from [place]. Please contact [facility] at [phone] with questions or concerns regarding your invoice.   Our billing staff will not be able to assist you with questions regarding bills from these companies.  You will be contacted with the lab results as soon as they are available. The fastest way to get your results is to activate your My Chart account. Instructions are located on the last page of this paperwork. If you have not heard from [place] regarding the results in 2 weeks,  please contact this office.      Preventive Care 40-64 Years, Female Preventive care refers to lifestyle choices and visits with your health care provider that can promote health and wellness. What does preventive care include?  A yearly physical exam. This is also called an annual well check.  Dental exams once or twice a year.  Routine eye exams. Ask your health care provider how often you should have your eyes checked.  Personal lifestyle choices, including: ? Daily care of your teeth and gums. ? Regular physical activity. ? Eating a healthy diet. ? Avoiding tobacco and drug use. ? Limiting alcohol use. ? Practicing safe sex. ? Taking low-dose aspirin daily starting at age 48. ? Taking vitamin and mineral supplements as recommended by your health care provider. What happens during an annual well check? The services and screenings done by your health care provider during your annual well check will depend on your age, overall health, lifestyle risk factors, and family history of disease. Counseling Your health care provider may ask you questions about your:  Alcohol use.  Tobacco use.  Drug use.  Emotional well-being.  Home and relationship well-being.  Sexual activity.  Eating habits.  Work and work Statistician.  Method of birth control.  Menstrual cycle.  Pregnancy history.  Screening You may have the following tests or measurements:  Height, weight, and BMI.  Blood pressure.  Lipid and cholesterol levels. These may be checked every 5 years, or more frequently if you are over 50  years old.  Skin check.  Lung cancer screening. You may have this screening every year starting at age 1 if you have a 30-pack-year history of smoking and currently smoke or have quit within the past 15 years.  Fecal occult blood test (FOBT) of the stool. You may have this test every year starting at age 27.  Flexible sigmoidoscopy or colonoscopy. You may have a  sigmoidoscopy every 5 years or a colonoscopy every 10 years starting at age 59.  Hepatitis C blood test.  Hepatitis B blood test.  Sexually transmitted disease (STD) testing.  Diabetes screening. This is done by checking your blood sugar (glucose) after you have not eaten for a while (fasting). You may have this done every 1-3 years.  Mammogram. This may be done every 1-2 years. Talk to your health care provider about when you should start having regular mammograms. This may depend on whether you have a family history of breast cancer.  BRCA-related cancer screening. This may be done if you have a family history of breast, ovarian, tubal, or peritoneal cancers.  Pelvic exam and Pap test. This may be done every 3 years starting at age 20. Starting at age 14, this may be done every 5 years if you have a Pap test in combination with an HPV test.  Bone density scan. This is done to screen for osteoporosis. You may have this scan if you are at high risk for osteoporosis.  Discuss your test results, treatment options, and if necessary, the need for more tests with your health care provider. Vaccines Your health care provider may recommend certain vaccines, such as:  Influenza vaccine. This is recommended every year.  Tetanus, diphtheria, and acellular pertussis (Tdap, Td) vaccine. You may need a Td booster every 10 years.  Varicella vaccine. You may need this if you have not been vaccinated.  Zoster vaccine. You may need this after age 47.  Measles, mumps, and rubella (MMR) vaccine. You may need at least one dose of MMR if you were born in 1957 or later. You may also need a second dose.  Pneumococcal 13-valent conjugate (PCV13) vaccine. You may need this if you have certain conditions and were not previously vaccinated.  Pneumococcal polysaccharide (PPSV23) vaccine. You may need one or two doses if you smoke cigarettes or if you have certain conditions.  Meningococcal vaccine. You may  need this if you have certain conditions.  Hepatitis A vaccine. You may need this if you have certain conditions or if you travel or work in places where you may be exposed to hepatitis A.  Hepatitis B vaccine. You may need this if you have certain conditions or if you travel or work in places where you may be exposed to hepatitis B.  Haemophilus influenzae type b (Hib) vaccine. You may need this if you have certain conditions.  Talk to your health care provider about which screenings and vaccines you need and how often you need them. This information is not intended to replace advice given to you by your health care provider. Make sure you discuss any questions you have with your health care provider. Document Released: 10/11/2015 Document Revised: 06/03/2016 Document Reviewed: 07/16/2015 Elsevier Interactive Patient Education  [employer].

## 2018-01-24 NOTE — Progress Notes (Signed)
Subjective:    Patient ID: Ann Miller, female    DOB: 1961-05-19, 57 y.o.   MRN: [id number]  01/24/2018  Annual Exam    HPI This 57 y.o. female presents for COMPLETE PHYSICAL EXAMINATION.    Last physical 3 years ago.  Last physical: Pap smear:  2016 Mammogram:  02/2017 Colonoscopy: due; age 42.  Colon polyps. Eye exam:  Every year; glasses. Dental exam:  Every six months.   Visual Acuity Screening   Right eye Left eye Both eyes  Without correction: _0  With correction:      BP Readings from Last 3 Encounters:  01/24/18 132/72  03/09/17 134/81  03/05/17 130/82   Wt Readings from Last 3 Encounters:  01/24/18 172 lb (78 kg)  05/20/17 187 lb 9.6 oz (85.1 kg)  04/22/17 186 lb 12.8 oz (84.7 kg)   Immunization History  Administered Date(s) Administered  . Influenza Inj Mdck Quad Pf 07/17/2017  . Influenza-Unspecified 07/14/2017, 08/12/2017  . MMR 05/29/2009  . PPD Test 05/29/2009  . Td 02/14/2003  . Tdap 05/29/2009    Health Maintenance  Topic Date Due  . COLONOSCOPY  01/08/2013  . INFLUENZA VACCINE  04/28/2018  . MAMMOGRAM  03/26/2019  . TETANUS/TDAP  05/30/2019  . PAP SMEAR  01/24/2021  . Hepatitis C Screening  Completed  . HIV Screening  Completed    Review of Systems  Constitutional: Negative for activity change, appetite change, chills, diaphoresis, fatigue, fever and unexpected weight change.  HENT: Negative for congestion, dental problem, drooling, ear discharge, ear pain, facial swelling, hearing loss, mouth sores, nosebleeds, postnasal drip, rhinorrhea, sinus pressure, sneezing, sore throat, tinnitus, trouble swallowing and voice change.   Eyes: Negative for photophobia, pain, discharge, redness, itching and visual disturbance.  Respiratory: Negative for apnea, cough, choking, chest tightness, shortness of breath, wheezing and stridor.   Cardiovascular: Negative for chest pain, palpitations and leg swelling.  Gastrointestinal:  Negative for abdominal distention, abdominal pain, anal bleeding, blood in stool, constipation, diarrhea, nausea, rectal pain and vomiting.  Endocrine: Negative for cold intolerance, heat intolerance, polydipsia, polyphagia and polyuria.  Genitourinary: Negative for decreased urine volume, difficulty urinating, dyspareunia, dysuria, enuresis, flank pain, frequency, genital sores, hematuria, menstrual problem, pelvic pain, urgency, vaginal bleeding, vaginal discharge and vaginal pain.  Musculoskeletal: Negative for arthralgias, back pain, gait problem, joint swelling, myalgias, neck pain and neck stiffness.  Skin: Negative for color change, pallor, rash and wound.  Allergic/Immunologic: Negative for environmental allergies, food allergies and immunocompromised state.  Neurological: Negative for dizziness, tremors, seizures, syncope, facial asymmetry, speech difficulty, weakness, light-headedness, numbness and headaches.  Hematological: Negative for adenopathy. Does not bruise/bleed easily.  Psychiatric/Behavioral: Negative for agitation, behavioral problems, confusion, decreased concentration, dysphoric mood, hallucinations, self-injury, sleep disturbance and suicidal ideas. The patient is not nervous/anxious and is not hyperactive.     Past Medical History:  Diagnosis Date  . Hx of adenomatous colonic polyps 06/01/2013  . Irritable bowel syndrome (IBS)    diarrhea predominant  . Thyroid disease   . Thyroid nodule    Past Surgical History:  Procedure Laterality Date  . ANKLE FRACTURE SURGERY Left   . BIOPSY THYROID     Allergies  Allergen Reactions  . Codeine    Current Outpatient Medications on File Prior to Visit  Medication Sig Dispense Refill  . cholecalciferol (VITAMIN D) 1000 units tablet Take 4,000 Units by mouth daily.    . Digestive Enzymes (DIGESTIVE ENZYME PO) Take by mouth.    Marland Kitchen  Misc Natural Products (TART CHERRY ADVANCED PO) Take by mouth.    . Multiple Vitamins-Calcium  (VIACTIV MULTI-VITAMIN) CHEW Chew 2 each by mouth 1 day or 1 dose.    Vladimir Faster Glycol-Propyl Glycol (SYSTANE) 0.4-0.3 % SOLN Apply to eye.     No current facility-administered medications on file prior to visit.    Social History   Socioeconomic History  . Marital status: Married    Spouse name: Not on file  . Number of children: Not on file  . Years of education: Not on file  . Highest education level: Not on file  Occupational History  . Not on file  Social Needs  . Financial resource strain: Not on file  . Food insecurity:    Worry: Not on file    Inability: Not on file  . Transportation needs:    Medical: Not on file    Non-medical: Not on file  Tobacco Use  . Smoking status: Never Smoker  . Smokeless tobacco: Never Used  Substance and Sexual Activity  . Alcohol use: Yes    Comment: 2 drinks a month  . Drug use: No  . Sexual activity: Yes    Birth control/protection: Post-menopausal  Lifestyle  . Physical activity:    Days per week: Not on file    Minutes per session: Not on file  . Stress: Not on file  Relationships  . Social connections:    Talks on phone: Not on file    Gets together: Not on file    Attends religious service: Not on file    Active member of club or organization: Not on file    Attends meetings of clubs or organizations: Not on file    Relationship status: Not on file  . Intimate partner violence:    Fear of current or ex partner: Not on file    Emotionally abused: Not on file    Physically abused: Not on file    Forced sexual activity: Not on file  Other Topics Concern  . Not on file  Social History Narrative   Marital status: married x 33 years; happily; no abuse; Moved from [place] in 2016      Children: (26, 24, 19); no grandchildren     Lives: with husband, youngest child.        Employment: unemployed; homemaker      Tobacco: none      Alcohol: socially      Drugs: none      Exercise: 3 days per week outdoor boot camp  females in action; 2-3 times per week yoga; 1 time per week pilates; four days per week exercise.   Water aerobics.   Family History  Problem Relation Age of Onset  . Diabetes Mother   . Hyperlipidemia Mother   . Hyperlipidemia Father   . Skin cancer Father        Basal cell; squamous cell  . Diabetes Sister   . Diabetes Maternal Grandmother   . Diabetes Maternal Grandfather   . Cancer Paternal Grandmother   . Cancer Paternal Grandfather   . Breast cancer Neg Hx        Objective:    BP 132/72   Pulse 78   Temp 98.4 F (36.9 C) (Oral)   Resp 16   Ht 5' 7.5" (1.715 m)   Wt 172 lb (78 kg)   LMP 04/16/2016 Comment: postmenopausal  SpO2 98%   BMI 26.54 kg/m  Physical Exam  Constitutional: She is oriented  to person, place, and time. She appears well-developed and well-nourished. No distress.  HENT:  Head: Normocephalic and atraumatic.  Right Ear: External ear normal.  Left Ear: External ear normal.  Nose: Nose normal.  Mouth/Throat: Oropharynx is clear and moist.  Eyes: Pupils are equal, round, and reactive to light. Conjunctivae and EOM are normal.  Neck: Normal range of motion. Neck supple. Carotid bruit is not present. No thyromegaly present.  Cardiovascular: Normal rate, regular rhythm, normal heart sounds and intact distal pulses. Exam reveals no gallop and no friction rub.  No murmur heard. Pulmonary/Chest: Effort normal and breath sounds normal. She has no wheezes. She has no rales.  Abdominal: Soft. Bowel sounds are normal. She exhibits no distension and no mass. There is no tenderness. There is no rebound and no guarding.  Genitourinary: Vagina normal and uterus normal. There is no rash, tenderness, lesion or injury on the right labia. There is no rash, tenderness, lesion or injury on the left labia. Right adnexum displays no mass, no tenderness and no fullness. Left adnexum displays no mass, no tenderness and no fullness.  Lymphadenopathy:    She has no cervical  adenopathy.  Neurological: She is alert and oriented to person, place, and time. No cranial nerve deficit.  Skin: Skin is warm and dry. No rash noted. She is not diaphoretic. No erythema. No pallor.  Psychiatric: She has a normal mood and affect. Her behavior is normal.   No results found. Depression screen [facility] 2/9 01/24/2018 03/09/2017 03/05/2017  Decreased Interest 0 0 0  Down, Depressed, Hopeless 0 0 0  PHQ - 2 Score 0 0 0   Fall Risk  01/24/2018 03/09/2017 03/05/2017  Falls in the past year? No No No        Assessment & Plan:   1. Routine physical examination   2. Multiple thyroid nodules   3. Vitamin D deficiency   4. Screening for diabetes mellitus   5. Pure hypercholesterolemia   6. Breast cancer screening   7. Screening for HIV (human immunodeficiency virus)   8. Need for hepatitis C screening test   9. Screening for cardiovascular condition   10. History of colonic polyps   11. Cervical cancer screening   12. BMI 26.0-26.9,adult     -anticipatory guidance provided --- exercise, weight loss, safe driving practices, calcium 650m twice daily. -obtain age appropriate screening labs and labs for chronic disease management. -refer for repeat thyroid ultrasound to follow-up on thyroid nodules.  Recommend weight loss, exercise for 30-60 minutes five days per week; recommend 1200 kcal restriction per day with a minimum of 60 grams of protein per day.  Eat 3 meals per day. Do not skip meals. Consider having a protein shake as a meal replacement to aid with eliminating meal skipping. Look for products with <220 calories, <7 gm sugar, and 20-30 gm protein.  Eat breakfast within 2 hours of getting up.   Make  your plate non-starchy vegetables,  protein, and  carbohydrates at lunch and dinner.   Aim for at least 64 oz. of calorie-free beverages daily (water, Crystal Light, diet green tea, etc.). Eliminate any sugary beverages such as regular soda, sweet tea, or fruit juice.    Pay attention to hunger and fullness cues.  Stop eating once you feel satisfied; don't wait until you feel full, stuffed, or sick from eating.  Choose lean meats and low fat/fat free dairy products.  Choose foods high in fiber such as fruits, vegetables, and whole grains (  brown rice, whole wheat pasta, whole wheat bread, etc.).  Limit foods with added sugar to <7 gm per serving.  Always eat in the kitchen/dining room.  Never eat in the bedroom or in front of the TV.        Orders Placed This Encounter  Procedures  . US THYROID    Wt 185/ no needs/ ins Furniture conservator/restorer order    Standing Status:   Future    Number of Occurrences:   1    Standing Expiration Date:   03/27/2019    Order Specific Question:   Reason for Exam (SYMPTOM  OR DIAGNOSIS REQUIRED)    Answer:   multinodular goiter; R superior nodule one year follow-up    Order Specific Question:   Preferred imaging location?    Answer:   [facility]  . CBC with Differential/Platelet  . Comprehensive metabolic panel    Order Specific Question:   Has the patient fasted?    Answer:   No  . Hemoglobin A1c  . Lipid panel    Order Specific Question:   Has the patient fasted?    Answer:   No  . TSH  . Vitamin D, 25-hydroxy  . T4, free  . HIV antibody  . Hepatitis C antibody  . Ambulatory referral to Gastroenterology    Referral Priority:   Routine    Referral Type:   Consultation    Referral Reason:   Specialty Services Required    Number of Visits Requested:   1  . POCT urinalysis dipstick  . EKG 12-Lead   Meds ordered this encounter  Medications  . levothyroxine (SYNTHROID, LEVOTHROID) 50 MCG tablet    Sig: Take 1 tablet (50 mcg total) by mouth daily before breakfast.    Dispense:  90 tablet    Refill:  3    Please delete any further refills for 54mg Levothyroxine.    Return in about 1 year (around 01/25/2019) for complete physical examiniation IRMA SANTIAGO.   Hadyn Azer MElayne Guerin M.D. [facility]  [facility] [place]  [phone]

## 2018-01-25 LAB — COMPREHENSIVE METABOLIC PANEL
ALBUMIN: 4.2 g/dL (ref 3.5–5.5)
ALT: 18 IU/L (ref 0–32)
AST: 18 IU/L (ref 0–40)
Albumin/Globulin Ratio: 1.7 (ref 1.2–2.2)
Alkaline Phosphatase: 95 IU/L (ref 39–117)
BUN / CREAT RATIO: 19 (ref 9–23)
BUN: 15 mg/dL (ref 6–24)
Bilirubin Total: 0.6 mg/dL (ref 0.0–1.2)
CHLORIDE: 105 mmol/L (ref 96–106)
CO2: 24 mmol/L (ref 20–29)
Calcium: 9.7 mg/dL (ref 8.7–10.2)
Creatinine, Ser: 0.8 mg/dL (ref 0.57–1.00)
GFR calc Af Amer: 96 mL/min/{1.73_m2} (ref >59)
GFR calc non Af Amer: 83 mL/min/{1.73_m2} (ref >59)
GLUCOSE: 94 mg/dL (ref 65–99)
Globulin, Total: 2.5 g/dL (ref 1.5–4.5)
Potassium: 4.8 mmol/L (ref 3.5–5.2)
SODIUM: 143 mmol/L (ref 134–144)
Total Protein: 6.7 g/dL (ref 6.0–8.5)

## 2018-01-25 LAB — LIPID PANEL
CHOL/HDL RATIO: 5.6 ratio — AB (ref 0.0–4.4)
Cholesterol, Total: 236 mg/dL — ABNORMAL HIGH (ref 100–199)
HDL: 42 mg/dL (ref >39)
LDL Calculated: 180 mg/dL — ABNORMAL HIGH (ref 0–99)
TRIGLYCERIDES: 72 mg/dL (ref 0–149)
VLDL Cholesterol Cal: 14 mg/dL (ref 5–40)

## 2018-01-25 LAB — CBC WITH DIFFERENTIAL/PLATELET
BASOS ABS: 0 10*3/uL (ref 0.0–0.2)
Basos: 0 %
EOS (ABSOLUTE): 0.2 10*3/uL (ref 0.0–0.4)
Eos: 5 %
HEMOGLOBIN: 13.3 g/dL (ref 11.1–15.9)
Hematocrit: 40.2 % (ref 34.0–46.6)
Immature Grans (Abs): 0 10*3/uL (ref 0.0–0.1)
Immature Granulocytes: 0 %
Lymphocytes Absolute: 1.9 10*3/uL (ref 0.7–3.1)
Lymphs: 43 %
MCH: 28.9 pg (ref 26.6–33.0)
MCHC: 33.1 g/dL (ref 31.5–35.7)
MCV: 87 fL (ref 79–97)
MONOS ABS: 0.4 10*3/uL (ref 0.1–0.9)
Monocytes: 9 %
NEUTROS ABS: 1.9 10*3/uL (ref 1.4–7.0)
Neutrophils: 43 %
Platelets: 239 10*3/uL (ref 150–379)
RBC: 4.61 x10E6/uL (ref 3.77–5.28)
RDW: 14.3 % (ref 12.3–15.4)
WBC: 4.5 10*3/uL (ref 3.4–10.8)

## 2018-01-25 LAB — TSH: TSH: 2.85 u[IU]/mL (ref 0.450–4.500)

## 2018-01-25 LAB — VITAMIN D 25 HYDROXY (VIT D DEFICIENCY, FRACTURES): Vit D, 25-Hydroxy: 65.7 ng/mL (ref 30.0–100.0)

## 2018-01-25 LAB — T4, FREE: Free T4: 1.19 ng/dL (ref 0.82–1.77)

## 2018-01-25 LAB — HEMOGLOBIN A1C
Est. average glucose Bld gHb Est-mCnc: 108 mg/dL
Hgb A1c MFr Bld: 5.4 % (ref 4.8–5.6)

## 2018-01-25 LAB — HIV ANTIBODY (ROUTINE TESTING W REFLEX): HIV Screen 4th Generation wRfx: NONREACTIVE

## 2018-01-25 LAB — HEPATITIS C ANTIBODY: HEP C VIRUS AB: 0.1 {s_co_ratio} (ref 0.0–0.9)

## 2018-01-27 LAB — PAP IG AND HPV HIGH-RISK
HPV, HIGH-RISK: NEGATIVE
PAP Smear Comment: 0

## 2018-02-11 ENCOUNTER — Telehealth: Payer: Self-pay | Admitting: Family Medicine

## 2018-02-11 ENCOUNTER — Ambulatory Visit
Admission: RE | Admit: 2018-02-11 | Discharge: 2018-02-11 | Disposition: A | Discharge status: Home / Self Care | Payer: Managed Care, Other (non HMO) | Source: Ambulatory Visit | Attending: Family Medicine | Admitting: Family Medicine

## 2018-02-11 DIAGNOSIS — E042 Nontoxic multinodular goiter: Secondary | ICD-10-CM

## 2018-02-11 NOTE — Telephone Encounter (Signed)
Copied from [place] [phone]. Topic: Quick Communication - See Telephone Encounter >> Feb 11, 2018  3:34 PM Cleaster Corin, NT wrote: CRM for notification. See Telephone encounter for: 02/11/18.  Pt. Calling with questions on when to schedule to have a mammogram done and also about referral that was sent to GI. She hasn't heard anything yet

## 2018-02-16 ENCOUNTER — Telehealth: Payer: Self-pay | Admitting: Internal Medicine

## 2018-02-16 DIAGNOSIS — Z86010 Personal history of colonic polyps: Secondary | ICD-10-CM

## 2018-02-17 ENCOUNTER — Encounter: Payer: Self-pay | Admitting: Internal Medicine

## 2018-02-17 NOTE — Telephone Encounter (Signed)
Records reviewed Colonoscopy 06/03/2013 in [place]  2 polyps both < 1 cm and 1 ssa and 1 adenoma  Recall for colonoscopy 05/2018 please

## 2018-02-17 NOTE — Telephone Encounter (Signed)
Left message for patient to return my call. Recall Colon for 05/2018 has been entered.

## 2018-02-17 NOTE — Telephone Encounter (Signed)
Patient has been notified of this 

## 2018-02-21 ENCOUNTER — Encounter: Payer: Self-pay | Admitting: Family Medicine

## 2018-02-24 ENCOUNTER — Other Ambulatory Visit: Payer: Self-pay | Admitting: Family Medicine

## 2018-02-24 DIAGNOSIS — Z1231 Encounter for screening mammogram for malignant neoplasm of breast: Secondary | ICD-10-CM

## 2018-03-27 ENCOUNTER — Encounter: Payer: Self-pay | Admitting: Family Medicine

## 2018-03-27 MED ORDER — LEVOTHYROXINE SODIUM 50 MCG PO TABS
50.0000 ug | ORAL_TABLET | Freq: Every day | ORAL | 3 refills | Status: DC
Start: 2018-03-27 — End: 2019-03-10

## 2018-04-04 ENCOUNTER — Ambulatory Visit
Admission: RE | Admit: 2018-04-04 | Discharge: 2018-04-04 | Disposition: A | Discharge status: Home / Self Care | Payer: Managed Care, Other (non HMO) | Source: Ambulatory Visit | Attending: Family Medicine | Admitting: Family Medicine

## 2018-04-04 DIAGNOSIS — Z1231 Encounter for screening mammogram for malignant neoplasm of breast: Secondary | ICD-10-CM

## 2018-04-05 ENCOUNTER — Encounter: Payer: Self-pay | Admitting: Internal Medicine

## 2018-04-28 ENCOUNTER — Ambulatory Visit (INDEPENDENT_AMBULATORY_CARE_PROVIDER_SITE_OTHER): Payer: Managed Care, Other (non HMO) | Admitting: Family Medicine

## 2018-04-28 ENCOUNTER — Other Ambulatory Visit: Payer: Self-pay

## 2018-04-28 VITALS — BP 120/83 | HR 84 | Temp 98.2°F | Ht 67.0 in | Wt 185.0 lb

## 2018-04-28 DIAGNOSIS — R05 Cough: Secondary | ICD-10-CM | POA: Diagnosis not present

## 2018-04-28 DIAGNOSIS — J069 Acute upper respiratory infection, unspecified: Secondary | ICD-10-CM | POA: Diagnosis not present

## 2018-04-28 DIAGNOSIS — R0989 Other specified symptoms and signs involving the circulatory and respiratory systems: Secondary | ICD-10-CM | POA: Diagnosis not present

## 2018-04-28 DIAGNOSIS — R059 Cough, unspecified: Secondary | ICD-10-CM

## 2018-04-28 MED ORDER — BENZONATATE 100 MG PO CAPS: 100.0000 mg | ORAL_CAPSULE | Freq: Three times a day (TID) | ORAL | 0 refills | Status: DC | PRN

## 2018-04-28 MED ORDER — FLUTICASONE PROPIONATE 50 MCG/ACT NA SUSP: 1.0000 | Freq: Two times a day (BID) | NASAL | 0 refills | Status: DC

## 2018-04-28 NOTE — Patient Instructions (Signed)
     IF you received an x-ray today, you will receive an invoice from [facility]. Please contact [facility] at [phone] with questions or concerns regarding your invoice.   IF you received labwork today, you will receive an invoice from [facility]. Please contact [facility] at [phone] with questions or concerns regarding your invoice.   Our billing staff will not be able to assist you with questions regarding bills from these companies.  You will be contacted with the lab results as soon as they are available. The fastest way to get your results is to activate your My Chart account. Instructions are located on the last page of this paperwork. If you have not heard from us regarding the results in 2 weeks, please contact this office.     

## 2018-04-28 NOTE — Progress Notes (Signed)
8/1/201911:53 AM  Ann Miller 26-Jun-1961, 57 y.o. female [id number]  Chief Complaint  Patient presents with  . Cough    had some fever on yesterday and sore throat. Taking Sudafed and Nyquil    HPI:   Patient is a 57 y.o. female who presents today for several days of cough but then yesterday got worse, had a fever, throat was hurting, started to have increased chest congestion. Not sleeping due to cough. Tired.  Denies ear pain, sinus pain Denies any productive cough, wheezing, SOB Denies h/o asthma Nonsmoker  Has been taking sudafed and nyquil - not helping  Fall Risk  04/28/2018 01/24/2018 03/09/2017 03/05/2017  Falls in the past year? No No No No     Depression screen [facility] 2/9 04/28/2018 01/24/2018 03/09/2017  Decreased Interest 0 0 0  Down, Depressed, Hopeless 0 0 0  PHQ - 2 Score 0 0 0    Allergies  Allergen Reactions  . Codeine     Prior to Admission medications   Medication Sig Start Date End Date Taking? Authorizing Provider  cholecalciferol (VITAMIN D) 1000 units tablet Take 4,000 Units by mouth daily.    [provider]  Digestive Enzymes (DIGESTIVE ENZYME PO) Take by mouth.    [provider]  levothyroxine (SYNTHROID, LEVOTHROID) 50 MCG tablet Take 1 tablet (50 mcg total) by mouth daily before breakfast. 03/27/18   Wardell Honour, MD  Misc Natural Products (TART CHERRY ADVANCED PO) Take by mouth.    [provider]  Multiple Vitamins-Calcium (VIACTIV MULTI-VITAMIN) CHEW Chew 2 each by mouth 1 day or 1 dose.    [provider]  Polyethyl Glycol-Propyl Glycol (SYSTANE) 0.4-0.3 % SOLN Apply to eye.    [provider]    Past Medical History:  Diagnosis Date  . Hx of adenomatous colonic polyps 06/01/2013  . Irritable bowel syndrome (IBS)    diarrhea predominant  . Thyroid disease   . Thyroid nodule     Past Surgical History:  Procedure Laterality Date  . ANKLE FRACTURE SURGERY Left   . BIOPSY THYROID      Social  History   Tobacco Use  . Smoking status: Never Smoker  . Smokeless tobacco: Never Used  Substance Use Topics  . Alcohol use: Yes    Comment: 2 drinks a month    Family History  Problem Relation Age of Onset  . Diabetes Mother   . Hyperlipidemia Mother   . Hyperlipidemia Father   . Skin cancer Father        Basal cell; squamous cell  . Diabetes Sister   . Diabetes Maternal Grandmother   . Diabetes Maternal Grandfather   . Cancer Paternal Grandmother   . Cancer Paternal Grandfather   . Breast cancer Neg Hx     ROS Per hpi  OBJECTIVE:  Blood pressure 120/83, pulse 84, temperature 98.2 F (36.8 C), temperature source Oral, height 5\' 7"  (1.702 m), weight 185 lb (83.9 kg), last menstrual period 04/16/2016, SpO2 98 %. Body mass index is 28.98 kg/m.   Physical Exam  Constitutional: She is oriented to person, place, and time. She appears well-developed and well-nourished.  HENT:  Head: Normocephalic and atraumatic.  Right Ear: Hearing, tympanic membrane, external ear and ear canal normal.  Left Ear: Hearing, tympanic membrane, external ear and ear canal normal.  Mouth/Throat: Oropharynx is clear and moist.  Eyes: Pupils are equal, round, and reactive to light. EOM are normal.  Neck: Neck supple.  Cardiovascular: Normal rate,  regular rhythm and normal heart sounds. Exam reveals no gallop and no friction rub.  No murmur heard. Pulmonary/Chest: Effort normal and breath sounds normal. She has no wheezes. She has no rales.  Lymphadenopathy:    She has no cervical adenopathy.  Neurological: She is alert and oriented to person, place, and time.  Skin: Skin is warm and dry.  Psychiatric: She has a normal mood and affect.  Nursing note and vitals reviewed.   ASSESSMENT and PLAN  1. Acute upper respiratory infection 2. Chest congestion 3. Cough Discussed supportive measures for URI: increase hydration, rest, OTC medications, etc. RTC precautions discussed. Other orders -  benzonatate (TESSALON) 100 MG capsule; Take 1-2 capsules (100-200 mg total) by mouth 3 (three) times daily as needed for cough. - fluticasone (FLONASE) 50 MCG/ACT nasal spray; Place 1 spray into both nostrils 2 (two) times daily.  Return if symptoms worsen or fail to improve.    Rutherford Guys, MD Primary Care at [place], [place] [address] Ph.  [phone] Fax [phone]

## 2018-05-08 ENCOUNTER — Encounter: Payer: Self-pay | Admitting: Family Medicine

## 2018-05-20 ENCOUNTER — Other Ambulatory Visit: Payer: Self-pay | Admitting: Family Medicine

## 2018-06-04 ENCOUNTER — Other Ambulatory Visit: Payer: Self-pay | Admitting: Family Medicine

## 2018-06-07 ENCOUNTER — Other Ambulatory Visit: Payer: Self-pay | Admitting: Family Medicine

## 2018-06-07 NOTE — Telephone Encounter (Signed)
Fluticasone  refill Last Refill: 05/20/18 # 16 grams Last OV: 04/28/18 PCP: Dr Grant Fontana Pharmacy:[facility] [address] [place], [place] is requesting 90 day prescription

## 2018-06-08 ENCOUNTER — Encounter: Payer: Self-pay | Admitting: Internal Medicine

## 2018-06-08 ENCOUNTER — Ambulatory Visit (AMBULATORY_SURGERY_CENTER): Payer: Self-pay

## 2018-06-08 VITALS — Ht 67.0 in | Wt 187.4 lb

## 2018-06-08 DIAGNOSIS — Z86010 Personal history of colonic polyps: Secondary | ICD-10-CM

## 2018-06-08 NOTE — Progress Notes (Signed)
No egg or soy allergy known to patient  Issues with anesthesia and  sedation with past surgeries. No issues with last colonoscopy with mod sedation.no intubation problems  No diet pills per patient No home 02 use per patient  No blood thinners per patient  Pt denies issues with constipation  No A fib or A flutter  EMMI video sent to pt's e mail. Pt declined  Pt would like to discuss possibility of moderate sedation with CRNA

## 2018-06-16 ENCOUNTER — Encounter: Payer: Self-pay | Admitting: Family Medicine

## 2018-06-22 ENCOUNTER — Ambulatory Visit (AMBULATORY_SURGERY_CENTER): Payer: Managed Care, Other (non HMO) | Admitting: Internal Medicine

## 2018-06-22 ENCOUNTER — Encounter: Payer: Self-pay | Admitting: Internal Medicine

## 2018-06-22 VITALS — BP 101/58 | HR 61 | Temp 98.6°F | Resp 12 | Ht 67.0 in | Wt 187.4 lb

## 2018-06-22 DIAGNOSIS — D122 Benign neoplasm of ascending colon: Secondary | ICD-10-CM

## 2018-06-22 DIAGNOSIS — Z86010 Personal history of colonic polyps: Secondary | ICD-10-CM

## 2018-06-22 MED ORDER — SODIUM CHLORIDE 0.9 % IV SOLN: 500.0000 mL | Freq: Once | INTRAVENOUS | Status: DC

## 2018-06-22 NOTE — Progress Notes (Signed)
Pt's states no medical or surgical changes since previsit or office visit. 

## 2018-06-22 NOTE — Op Note (Signed)
[facility] Patient Name: Ann Miller Procedure Date: 06/22/2018 10:32 AM MRN: [id number] Endoscopist: Gatha Mayer , MD Age: 57 Referring MD:  Date of Birth: [birth year] Gender: Female Account #: [id number] Procedure:                Colonoscopy Indications:              Surveillance: Personal history of adenomatous                            polyps on last colonoscopy 5 years ago Medicines:                Propofol per Anesthesia, Monitored Anesthesia Care Procedure:                Pre-Anesthesia Assessment:                           - Prior to the procedure, a History and Physical                            was performed, and patient medications and                            allergies were reviewed. The patient's tolerance of                            previous anesthesia was also reviewed. The risks                            and benefits of the procedure and the sedation                            options and risks were discussed with the patient.                            All questions were answered, and informed consent                            was obtained. Prior Anticoagulants: The patient has                            taken no previous anticoagulant or antiplatelet                            agents. ASA Grade Assessment: II - A patient with                            mild systemic disease. After reviewing the risks                            and benefits, the patient was deemed in                            satisfactory condition to undergo the procedure.  After obtaining informed consent, the colonoscope                            was passed under direct vision. Throughout the                            procedure, the patient's blood pressure, pulse, and                            oxygen saturations were monitored continuously. The                            Colonoscope was introduced through the anus and   advanced to the the cecum, identified by                            appendiceal orifice and ileocecal valve. The                            colonoscopy was performed without difficulty. The                            patient tolerated the procedure well. The quality                            of the bowel preparation was excellent. The                            ileocecal valve, appendiceal orifice, and rectum                            were photographed. The bowel preparation used was                            Miralax. Scope In: 10:39:43 AM Scope Out: 10:54:29 AM Scope Withdrawal Time: 0 hours 11 minutes 44 seconds  Total Procedure Duration: 0 hours 14 minutes 46 seconds  Findings:                 The perianal and digital rectal examinations were                            normal.                           A 6 mm polyp was found in the ascending colon. The                            polyp was sessile. The polyp was removed with a                            cold snare. Resection and retrieval were complete.                            Verification of patient identification for the  specimen was done. Estimated blood loss was minimal.                           Multiple diverticula were found in the entire colon.                           The exam was otherwise without abnormality on                            direct and retroflexion views. Complications:            No immediate complications. Estimated Blood Loss:     Estimated blood loss was minimal. Impression:               - One 6 mm polyp in the ascending colon, removed                            with a cold snare. Resected and retrieved.                           - Diverticulosis in the entire examined colon.                           - The examination was otherwise normal on direct                            and retroflexion views.                           - Personal history of colonic polyps 2014 - ssa  and                            tubular adenoma both < 1 cm. Recommendation:           - Patient has a contact number available for                            emergencies. The signs and symptoms of potential                            delayed complications were discussed with the                            patient. Return to normal activities tomorrow.                            Written discharge instructions were provided to the                            patient.                           - Resume previous diet.                           - Continue present medications.                           -  Repeat colonoscopy is recommended for                            surveillance. The colonoscopy date will be                            determined after pathology results from today's                            exam become available for review. Gatha Mayer, MD 06/22/2018 11:00:53 AM This report has been signed electronically.

## 2018-06-22 NOTE — Progress Notes (Signed)
Patient wants to discuss Moderate sedation versus propofol. Doesn't want a "deep sedation." SM

## 2018-06-22 NOTE — Progress Notes (Signed)
Called to room to assist during endoscopic procedure.  Patient ID and intended procedure confirmed with present staff. Received instructions for my participation in the procedure from the performing physician.  

## 2018-06-22 NOTE — Progress Notes (Signed)
Report given to PACU, vss 

## 2018-06-22 NOTE — Patient Instructions (Addendum)
You did have one small polyp that I removed and will get analyzed.  You also have a condition called diverticulosis - common and not usually a problem. Please read the handout provided.  I will let you know pathology results and when to have another routine colonoscopy by mail and/or My Chart.  I appreciate the opportunity to care for you. Gatha Mayer, MD, [facility]  ** Handouts given on polyps and diverticulosis **   YOU HAD AN ENDOSCOPIC PROCEDURE TODAY AT [employer]:   Refer to the procedure report that was given to you for any specific questions about what was found during the examination.  If the procedure report does not answer your questions, please call your gastroenterologist to clarify.  If you requested that your care partner not be given the details of your procedure findings, then the procedure report has been included in a sealed envelope for you to review at your convenience later.  YOU SHOULD EXPECT: Some feelings of bloating in the abdomen. Passage of more gas than usual.  Walking can help get rid of the air that was put into your GI tract during the procedure and reduce the bloating. If you had a lower endoscopy (such as a colonoscopy or flexible sigmoidoscopy) you may notice spotting of blood in your stool or on the toilet paper. If you underwent a bowel prep for your procedure, you may not have a normal bowel movement for a few days.  Please Note:  You might notice some irritation and congestion in your nose or some drainage.  This is from the oxygen used during your procedure.  There is no need for concern and it should clear up in a day or so.  SYMPTOMS TO REPORT IMMEDIATELY:   Following lower endoscopy (colonoscopy or flexible sigmoidoscopy):  Excessive amounts of blood in the stool  Significant tenderness or worsening of abdominal pains  Swelling of the abdomen that is new, acute  Fever of 100F or higher For urgent or emergent issues, a  gastroenterologist can be reached at any hour by calling [phone].   DIET:  We do recommend a small meal at first, but then you may proceed to your regular diet.  Drink plenty of fluids but you should avoid alcoholic beverages for 24 hours.  ACTIVITY:  You should plan to take it easy for the rest of today and you should NOT DRIVE or use heavy machinery until tomorrow (because of the sedation medicines used during the test).    FOLLOW UP: Our staff will call the number listed on your records the next business day following your procedure to check on you and address any questions or concerns that you may have regarding the information given to you following your procedure. If we do not reach you, we will leave a message.  However, if you are feeling well and you are not experiencing any problems, there is no need to return our call.  We will assume that you have returned to your regular daily activities without incident.  If any biopsies were taken you will be contacted by phone or by letter within the next 1-3 weeks.  Please call us at [phone] if you have not heard about the biopsies in 3 weeks.    SIGNATURES/CONFIDENTIALITY: You and/or your care partner have signed paperwork which will be entered into your electronic medical record.  These signatures attest to the fact that that the information above on your After Visit Summary has  been reviewed and is understood.  Full responsibility of the confidentiality of this discharge information lies with you and/or your care-partner.

## 2018-06-23 ENCOUNTER — Telehealth: Payer: Self-pay

## 2018-06-23 NOTE — Telephone Encounter (Signed)
Number did not work when tried to call patient.

## 2018-06-23 NOTE — Telephone Encounter (Signed)
No answer, left message to call back later today, B.Delbra Zellars RN. 

## 2018-06-30 ENCOUNTER — Encounter: Payer: Self-pay | Admitting: Internal Medicine

## 2018-06-30 NOTE — Progress Notes (Signed)
ssp Recall 2024 My Chart

## 2018-09-28 DIAGNOSIS — Z87442 Personal history of urinary calculi: Secondary | ICD-10-CM

## 2018-09-28 HISTORY — DX: Personal history of urinary calculi: Z87.442

## 2018-10-03 ENCOUNTER — Emergency Department (HOSPITAL_COMMUNITY): Payer: Managed Care, Other (non HMO)

## 2018-10-03 ENCOUNTER — Encounter (HOSPITAL_COMMUNITY): Payer: Self-pay

## 2018-10-03 ENCOUNTER — Other Ambulatory Visit: Payer: Self-pay

## 2018-10-03 ENCOUNTER — Emergency Department (HOSPITAL_COMMUNITY)
Admission: EM | Admit: 2018-10-03 | Discharge: 2018-10-03 | Disposition: A | Discharge status: Home / Self Care | Payer: Managed Care, Other (non HMO) | Attending: Emergency Medicine | Admitting: Emergency Medicine

## 2018-10-03 DIAGNOSIS — R109 Unspecified abdominal pain: Secondary | ICD-10-CM

## 2018-10-03 DIAGNOSIS — R3915 Urgency of urination: Secondary | ICD-10-CM | POA: Diagnosis present

## 2018-10-03 DIAGNOSIS — N138 Other obstructive and reflux uropathy: Secondary | ICD-10-CM | POA: Insufficient documentation

## 2018-10-03 DIAGNOSIS — Z79899 Other long term (current) drug therapy: Secondary | ICD-10-CM | POA: Diagnosis not present

## 2018-10-03 DIAGNOSIS — N200 Calculus of kidney: Secondary | ICD-10-CM | POA: Insufficient documentation

## 2018-10-03 LAB — COMPREHENSIVE METABOLIC PANEL
ALBUMIN: 4.3 g/dL (ref 3.5–5.0)
ALT: 20 U/L (ref 0–44)
AST: 23 U/L (ref 15–41)
Alkaline Phosphatase: 72 U/L (ref 38–126)
Anion gap: 9 (ref 5–15)
BUN: 17 mg/dL (ref 6–20)
CO2: 24 mmol/L (ref 22–32)
Calcium: 9.3 mg/dL (ref 8.9–10.3)
Chloride: 106 mmol/L (ref 98–111)
Creatinine, Ser: 1.06 mg/dL — ABNORMAL HIGH (ref 0.44–1.00)
GFR calc Af Amer: >60 mL/min (ref >60)
GFR calc non Af Amer: 59 mL/min — ABNORMAL LOW (ref >60)
Glucose, Bld: 152 mg/dL — ABNORMAL HIGH (ref 70–99)
Potassium: 3.6 mmol/L (ref 3.5–5.1)
Sodium: 139 mmol/L (ref 135–145)
Total Bilirubin: 1.1 mg/dL (ref 0.3–1.2)
Total Protein: 7.2 g/dL (ref 6.5–8.1)

## 2018-10-03 LAB — CBC WITH DIFFERENTIAL/PLATELET
Abs Immature Granulocytes: 0.05 10*3/uL (ref 0.00–0.07)
BASOS ABS: 0.1 10*3/uL (ref 0.0–0.1)
Basophils Relative: 1 %
EOS PCT: 1 %
Eosinophils Absolute: 0.1 10*3/uL (ref 0.0–0.5)
HCT: 41.5 % (ref 36.0–46.0)
Hemoglobin: 12.7 g/dL (ref 12.0–15.0)
Immature Granulocytes: 0 %
LYMPHS PCT: 16 %
Lymphs Abs: 1.9 10*3/uL (ref 0.7–4.0)
MCH: 25.5 pg — ABNORMAL LOW (ref 26.0–34.0)
MCHC: 30.6 g/dL (ref 30.0–36.0)
MCV: 83.2 fL (ref 80.0–100.0)
Monocytes Absolute: 0.6 10*3/uL (ref 0.1–1.0)
Monocytes Relative: 5 %
Neutro Abs: 9.3 10*3/uL — ABNORMAL HIGH (ref 1.7–7.7)
Neutrophils Relative %: 77 %
Platelets: 251 10*3/uL (ref 150–400)
RBC: 4.99 MIL/uL (ref 3.87–5.11)
RDW: 14.4 % (ref 11.5–15.5)
WBC: 12 10*3/uL — ABNORMAL HIGH (ref 4.0–10.5)
nRBC: 0 % (ref 0.0–0.2)

## 2018-10-03 LAB — URINALYSIS, ROUTINE W REFLEX MICROSCOPIC
Bilirubin Urine: NEGATIVE
Glucose, UA: NEGATIVE mg/dL
Ketones, ur: NEGATIVE mg/dL
Leukocytes, UA: NEGATIVE
Nitrite: NEGATIVE
Protein, ur: 30 mg/dL — AB
Specific Gravity, Urine: >1.03 — ABNORMAL HIGH (ref 1.005–1.030)
pH: 5 (ref 5.0–8.0)

## 2018-10-03 LAB — URINALYSIS, MICROSCOPIC (REFLEX)

## 2018-10-03 MED ORDER — TAMSULOSIN HCL 0.4 MG PO CAPS: 0.4000 mg | ORAL_CAPSULE | Freq: Every day | ORAL | 0 refills | Status: AC

## 2018-10-03 MED ORDER — MORPHINE SULFATE (PF) 4 MG/ML IV SOLN
4.0000 mg | Freq: Once | INTRAVENOUS | Status: AC
  Administered 2018-10-03: 4 mg via INTRAVENOUS
  Filled 2018-10-03: qty 1

## 2018-10-03 MED ORDER — KETOROLAC TROMETHAMINE 30 MG/ML IJ SOLN
30.0000 mg | Freq: Once | INTRAMUSCULAR | Status: AC
  Administered 2018-10-03: 30 mg via INTRAVENOUS
  Filled 2018-10-03: qty 1

## 2018-10-03 MED ORDER — OXYCODONE-ACETAMINOPHEN 5-325 MG PO TABS: 2.0000 | ORAL_TABLET | ORAL | 0 refills | Status: AC | PRN

## 2018-10-03 MED ORDER — ONDANSETRON HCL 4 MG/2ML IJ SOLN
4.0000 mg | Freq: Once | INTRAMUSCULAR | Status: AC
  Administered 2018-10-03: 4 mg via INTRAVENOUS
  Filled 2018-10-03: qty 2

## 2018-10-03 MED ORDER — SODIUM CHLORIDE 0.9 % IV BOLUS
1000.0000 mL | Freq: Once | INTRAVENOUS | Status: AC
  Administered 2018-10-03: 1000 mL via INTRAVENOUS

## 2018-10-03 MED ORDER — IBUPROFEN 600 MG PO TABS: 600.0000 mg | ORAL_TABLET | Freq: Four times a day (QID) | ORAL | 0 refills | Status: AC | PRN

## 2018-10-03 MED ORDER — FENTANYL CITRATE (PF) 100 MCG/2ML IJ SOLN
50.0000 ug | Freq: Once | INTRAMUSCULAR | Status: AC
  Administered 2018-10-03: 50 ug via INTRAVENOUS
  Filled 2018-10-03: qty 2

## 2018-10-03 NOTE — ED Provider Notes (Signed)
[place] DEPT Provider Note   CSN: [id number] Arrival date & time: 10/03/18  1884     History   Chief Complaint Chief Complaint  Patient presents with  . Urinary Retention    HPI Ann Miller is a 58 y.o. female.  58 y/o female with a PMH of IBS, thyroid disease is to the ED with a chief complaint of urinary urgency since 5 AM this morning.  Patient reports waking up with urinary urgency and attempting to urinate but able to do so.  She also reports a left flank sharp pain rating to her left groin.  She also endorses one episode of vomiting along with nausea.  Reports the pain is worse with laying flat.  She has not tried any medical therapy for relieving symptoms.  She denies any fever, chest pain, shortness of breath.     Past Medical History:  Diagnosis Date  . Hx of adenomatous colonic polyps 06/01/2013  . Irritable bowel syndrome (IBS)    diarrhea predominant  . Thyroid disease   . Thyroid nodule     Patient Active Problem List   Diagnosis Date Noted  . Hx of adenomatous colonic polyps 06/01/2013    Past Surgical History:  Procedure Laterality Date  . ANKLE FRACTURE SURGERY Left   . BIOPSY THYROID    . COLONOSCOPY    . WISDOM TOOTH EXTRACTION  1980     OB History   No obstetric history on file.      Home Medications    Prior to Admission medications   Medication Sig Start Date End Date Taking? Authorizing Provider  BIOGAIA PROBIOTIC (BIOGAIA PROBIOTIC) LIQD Take by mouth daily at 8 pm.   Yes [provider]  cholecalciferol (VITAMIN D) 1000 units tablet Take 4,000 Units by mouth daily.   Yes [provider]  Cyanocobalamin (VITAMIN B 12 PO) Take by mouth.   Yes [provider]  levothyroxine (SYNTHROID, LEVOTHROID) 50 MCG tablet Take 1 tablet (50 mcg total) by mouth daily before breakfast. 03/27/18  Yes Wardell Honour, MD  Multiple Vitamins-Calcium (VIACTIV MULTI-VITAMIN) CHEW Chew 2 each by  mouth 1 day or 1 dose.   Yes [provider]  Polyethyl Glycol-Propyl Glycol (SYSTANE) 0.4-0.3 % SOLN Apply 1-2 drops to eye as needed (dry eyes).    Yes [provider]  benzonatate (TESSALON) 100 MG capsule Take 1-2 capsules (100-200 mg total) by mouth 3 (three) times daily as needed for cough. Patient not taking: Reported on 10/03/2018 04/28/18   Rutherford Guys, MD  fluticasone [facility]) 50 MCG/ACT nasal spray PLACE 1 SPRAY INTO BOTH NOSTRILS 2 (TWO) TIMES DAILY. Patient not taking: Reported on 10/03/2018 06/11/18   Rutherford Guys, MD  ibuprofen (ADVIL,MOTRIN) 600 MG tablet Take 1 tablet (600 mg total) by mouth every 6 (six) hours as needed for up to 3 days. 10/03/18 10/06/18  Janeece Fitting, PA-C  oxyCODONE-acetaminophen (PERCOCET/ROXICET) 5-325 MG tablet Take 2 tablets by mouth every 4 (four) hours as needed for up to 3 days for severe pain. 10/03/18 10/06/18  Janeece Fitting, PA-C  tamsulosin (FLOMAX) 0.4 MG CAPS capsule Take 1 capsule (0.4 mg total) by mouth daily for 14 days. 10/03/18 10/17/18  Janeece Fitting, PA-C    Family History Family History  Problem Relation Age of Onset  . Diabetes Mother   . Hyperlipidemia Mother   . Hyperlipidemia Father   . Skin cancer Father        Basal cell; squamous cell  .  Colon polyps Father   . Diabetes Sister   . Diabetes Maternal Grandmother   . Diabetes Maternal Grandfather   . Cancer Paternal Grandmother   . Cancer Paternal Grandfather   . Colon polyps Paternal Grandfather   . Breast cancer Neg Hx   . Rectal cancer Neg Hx   . Stomach cancer Neg Hx   . Colon cancer Neg Hx     Social History Social History   Tobacco Use  . Smoking status: Never Smoker  . Smokeless tobacco: Never Used  Substance Use Topics  . Alcohol use: Yes    Comment: 2 drinks a month  . Drug use: No     Allergies   Codeine   Review of Systems Review of Systems  Constitutional: Negative for fever.  HENT: Negative for sore throat.   Respiratory: Negative  for shortness of breath.   Cardiovascular: Negative for chest pain.  Gastrointestinal: Positive for nausea and vomiting.  Genitourinary: Positive for difficulty urinating and flank pain. Negative for dysuria.  Musculoskeletal: Positive for back pain.  Skin: Negative for pallor and wound.  All other systems reviewed and are negative.    Physical Exam Updated Vital Signs BP (!) 161/94   Pulse 73   Temp 97.6 F (36.4 C)   Resp 16   Ht 5\' 7"  (1.702 m)   Wt 84.8 kg   LMP 04/16/2016 Comment: postmenopausal  SpO2 100%   BMI 29.29 kg/m   Physical Exam Vitals signs and nursing note reviewed.  Constitutional:      General: She is not in acute distress.    Appearance: She is well-developed.  HENT:     Head: Normocephalic and atraumatic.     Mouth/Throat:     Pharynx: No oropharyngeal exudate.  Eyes:     Pupils: Pupils are equal, round, and reactive to light.  Neck:     Musculoskeletal: Normal range of motion.  Cardiovascular:     Rate and Rhythm: Regular rhythm.     Heart sounds: Normal heart sounds.  Pulmonary:     Effort: Pulmonary effort is normal. No respiratory distress.     Breath sounds: Normal breath sounds.  Abdominal:     General: Bowel sounds are normal. There is no distension.     Palpations: Abdomen is soft.     Tenderness: There is abdominal tenderness. There is left CVA tenderness. There is no right CVA tenderness.    Musculoskeletal:        General: No tenderness or deformity.     Right lower leg: No edema.     Left lower leg: No edema.  Skin:    General: Skin is warm and dry.  Neurological:     Mental Status: She is alert and oriented to person, place, and time.      ED Treatments / Results  Labs (all labs ordered are listed, but only abnormal results are displayed) Labs Reviewed  URINALYSIS, ROUTINE W REFLEX MICROSCOPIC - Abnormal; Notable for the following components:      Result Value   Specific Gravity, Urine >1.030 (*)    Hgb urine  dipstick LARGE (*)    Protein, ur 30 (*)    All other components within normal limits  CBC WITH DIFFERENTIAL/PLATELET - Abnormal; Notable for the following components:   WBC 12.0 (*)    MCH 25.5 (*)    Neutro Abs 9.3 (*)    All other components within normal limits  COMPREHENSIVE METABOLIC PANEL - Abnormal; Notable for the  following components:   Glucose, Bld 152 (*)    Creatinine, Ser 1.06 (*)    GFR calc non Af Amer 59 (*)    All other components within normal limits  URINALYSIS, MICROSCOPIC (REFLEX) - Abnormal; Notable for the following components:   Bacteria, UA MANY (*)    All other components within normal limits    EKG None  Radiology Ct Renal Stone Study  Result Date: 10/03/2018 CLINICAL DATA:  Flank pain with recurrent disease suspected EXAM: CT ABDOMEN AND PELVIS WITHOUT CONTRAST TECHNIQUE: Multidetector CT imaging of the abdomen and pelvis was performed following the standard protocol without IV contrast. COMPARISON:  None. FINDINGS: Lower chest:  No contributory findings. Hepatobiliary: No focal liver abnormality.No evidence of biliary obstruction or stone. Pancreas: Unremarkable. Spleen: Unremarkable. Adrenals/Urinary Tract: Negative adrenals. Mild left hydroureteronephrosis due to a 2 mm stone in the far distal left ureter. There is associated left perinephric stranding. No additional urolithiasis noted. 11 mm partially exophytic right renal lesion that is isodense to adjacent parenchyma. Negative decompressed urinary bladder. Stomach/Bowel:  No obstruction. No appendicitis. Vascular/Lymphatic: No acute vascular abnormality. No mass or adenopathy. Reproductive:No pathologic findings. Other: No ascites or pneumoperitoneum. Musculoskeletal: No acute abnormalities. Focal L5-S1 disc narrowing with endplate degeneration. IMPRESSION: 1. Obstructing 2 mm distal left ureteral calculus. 2. 11 mm right renal lesion that could be solid or complex cystic. Recommend attempted renal ultrasound  characterization at outpatient follow-up. Electronically Signed   By: Monte Fantasia M.D.   On: 10/03/2018 09:18    Procedures Procedures (including critical care time)  Medications Ordered in ED Medications  fentaNYL (SUBLIMAZE) injection 50 mcg (50 mcg Intravenous Given 10/03/18 0844)  sodium chloride 0.9 % bolus 1,000 mL (0 mLs Intravenous Stopped 10/03/18 0948)  morphine 4 MG/ML injection 4 mg (4 mg Intravenous Given 10/03/18 0949)  ondansetron (ZOFRAN) injection 4 mg (4 mg Intravenous Given 10/03/18 0949)  ketorolac (TORADOL) 30 MG/ML injection 30 mg (30 mg Intravenous Given 10/03/18 1012)     Initial Impression / Assessment and Plan / ED Course  I have reviewed the triage vital signs and the nursing notes.  Pertinent labs & imaging results that were available during my care of the patient were reviewed by me and considered in my medical decision making (see chart for details).    Patient presents with left flank pain x 5 hours ago.Reports waking up with urinary urgency and being unable to void. Has not been able to date except for a very small amount while in the ED.  She was bladder scanned twice by RN who reported no urine was in the bladder.  Patient for stone disease, patient reports she would like to avoid any morphine or codeine as she gets very nauseated with this, we will try her on some fentanyl along with a bolus to help stone pass. UA showed many bacteria, squamous posterior 5, large amount of hemoglobin.  CT Renal study showed: 1. Obstructing 2 mm distal left ureteral calculus.  2. 11 mm right renal lesion that could be solid or complex cystic.  Recommend attempted renal ultrasound characterization at outpatient  follow-up.   10:03 AM Spoke to Dr. Jeffie Pollock urology who advised patient be given Toradol in order to help with the pain along with sent home with tamsulosin and pain medication. The stone is 2 mm and likely to pass but due to the location likely causing patient's  symptoms of urgency.  Will try patient on morphine, toradol and reassess after medication.  11:02 AM patient reports feeling much better after pain medication, at this time we will send her home with Percocet also ibuprofen if she is unable to tolerate the Percocet as she reports nausea with this.  She is advised to follow-up with Dr. Thurmond Butts from urology as needed.  Patient understands and agrees with management, afebrile with unremarkable vital signs at this time.  Stable for discharge.  Final Clinical Impressions(s) / ED Diagnoses   Final diagnoses:  Left flank pain  Urinary tract obstruction by kidney stone    ED Discharge Orders         Ordered    tamsulosin (FLOMAX) 0.4 MG CAPS capsule  Daily     10/03/18 1013    oxyCODONE-acetaminophen (PERCOCET/ROXICET) 5-325 MG tablet  Every 4 hours PRN     10/03/18 1100    ibuprofen (ADVIL,MOTRIN) 600 MG tablet  Every 6 hours PRN     10/03/18 1100           Janeece Fitting, PA-C 10/03/18 [place], [place], DO 10/03/18 1416

## 2018-10-03 NOTE — ED Notes (Signed)
Bladder scanned patient twice with amount in bladder of 58ml. Patient complains of pain on left side

## 2018-10-03 NOTE — Discharge Instructions (Addendum)
I have prescribed medication to help with your pain, Percocet please take this as directed.  I have also prescribed ibuprofen to help with your pain if unable to tolerate Percocet you may want to take ibuprofen instead.  I have also provided tamsulosin which is a medication that should help with expulsion of your 2 mm kidney stone.  There is a referral to Dr. Jeffie Pollock urologist schedule an appointment to see him as needed.  If you experience any fever, worsening symptoms, blood in your urine you may return to the ED for reevaluation.

## 2018-10-03 NOTE — ED Triage Notes (Signed)
Patient reports that she was only able to urinate a small amount this AM at 0500

## 2018-10-27 ENCOUNTER — Telehealth: Payer: Self-pay | Admitting: Family Medicine

## 2018-10-27 NOTE — Telephone Encounter (Signed)
LVM for pt to call the office and reschedule appt that was originally on 01/10/19 with Dr. Pamella Pert. Due to Dr. Pamella Pert being out of the office, pt will need to be rescheduled. When pt calls back, please reschedule at their convenience. Thank you!

## 2018-11-11 ENCOUNTER — Other Ambulatory Visit: Payer: Self-pay | Admitting: Urology

## 2018-11-11 DIAGNOSIS — N281 Cyst of kidney, acquired: Secondary | ICD-10-CM

## 2018-11-25 ENCOUNTER — Ambulatory Visit
Admission: RE | Admit: 2018-11-25 | Discharge: 2018-11-25 | Disposition: A | Discharge status: Home / Self Care | Payer: 59 | Source: Ambulatory Visit | Attending: Urology | Admitting: Urology

## 2018-11-25 DIAGNOSIS — N281 Cyst of kidney, acquired: Secondary | ICD-10-CM

## 2018-11-25 MED ORDER — GADOBENATE DIMEGLUMINE 529 MG/ML IV SOLN
17.0000 mL | Freq: Once | INTRAVENOUS | Status: AC | PRN
  Administered 2018-11-25: 17 mL via INTRAVENOUS

## 2018-12-02 ENCOUNTER — Telehealth: Payer: Self-pay | Admitting: Family Medicine

## 2018-12-02 NOTE — Telephone Encounter (Signed)
Please let patient know that I have received an MRI report that is concerning for renal lesion  If she is not seeing urology for this, she needs to.  Do I need to make a referral ?  Also if she has not established with a new PCP, since Dr Tamala Julian has left the practice, please assist patient is establishing with a new PCP.  Thanks I Pamella Pert, MD

## 2018-12-02 NOTE — Telephone Encounter (Signed)
Spoke with pt, she is seeing Dr. Diona Fanti to the renal lesion. She is waiting for the phone call to have the lesion removed. She says she has chosen you as her PCP, she has a physical scheduled with you in April 2020

## 2018-12-08 NOTE — Progress Notes (Signed)
MRI abdomen without and with contrast Lower chest unremarkable

## 2019-01-06 ENCOUNTER — Encounter: Payer: Managed Care, Other (non HMO) | Admitting: Family Medicine

## 2019-01-10 ENCOUNTER — Other Ambulatory Visit: Payer: Self-pay

## 2019-01-10 ENCOUNTER — Encounter: Payer: Managed Care, Other (non HMO) | Admitting: Family Medicine

## 2019-01-10 DIAGNOSIS — Z1329 Encounter for screening for other suspected endocrine disorder: Secondary | ICD-10-CM

## 2019-01-10 DIAGNOSIS — Z1389 Encounter for screening for other disorder: Secondary | ICD-10-CM

## 2019-01-10 DIAGNOSIS — Z13220 Encounter for screening for lipoid disorders: Secondary | ICD-10-CM

## 2019-01-13 ENCOUNTER — Other Ambulatory Visit: Payer: Self-pay

## 2019-01-13 ENCOUNTER — Ambulatory Visit (INDEPENDENT_AMBULATORY_CARE_PROVIDER_SITE_OTHER): Payer: 59 | Admitting: Family Medicine

## 2019-01-13 DIAGNOSIS — Z1329 Encounter for screening for other suspected endocrine disorder: Secondary | ICD-10-CM | POA: Diagnosis not present

## 2019-01-13 DIAGNOSIS — Z1389 Encounter for screening for other disorder: Secondary | ICD-10-CM

## 2019-01-13 DIAGNOSIS — Z13220 Encounter for screening for lipoid disorders: Secondary | ICD-10-CM | POA: Diagnosis not present

## 2019-01-13 LAB — POCT URINALYSIS DIP (MANUAL ENTRY)
Bilirubin, UA: NEGATIVE
Glucose, UA: NEGATIVE mg/dL
Ketones, POC UA: NEGATIVE mg/dL
Nitrite, UA: NEGATIVE
Protein Ur, POC: NEGATIVE mg/dL
Spec Grav, UA: 1.02 (ref 1.010–1.025)
Urobilinogen, UA: 0.2 E.U./dL
pH, UA: 6 (ref 5.0–8.0)

## 2019-01-14 LAB — LIPID PANEL
Chol/HDL Ratio: 6.5 ratio — ABNORMAL HIGH (ref 0.0–4.4)
Cholesterol, Total: 273 mg/dL — ABNORMAL HIGH (ref 100–199)
HDL: 42 mg/dL (ref >39)
LDL Calculated: 208 mg/dL — ABNORMAL HIGH (ref 0–99)
Triglycerides: 113 mg/dL (ref 0–149)
VLDL Cholesterol Cal: 23 mg/dL (ref 5–40)

## 2019-01-14 LAB — TSH: TSH: 3.14 u[IU]/mL (ref 0.450–4.500)

## 2019-01-18 ENCOUNTER — Encounter: Payer: Self-pay | Admitting: Family Medicine

## 2019-01-18 ENCOUNTER — Telehealth (INDEPENDENT_AMBULATORY_CARE_PROVIDER_SITE_OTHER): Payer: 59 | Admitting: Family Medicine

## 2019-01-18 ENCOUNTER — Other Ambulatory Visit: Payer: Self-pay

## 2019-01-18 DIAGNOSIS — Z0000 Encounter for general adult medical examination without abnormal findings: Secondary | ICD-10-CM | POA: Diagnosis not present

## 2019-01-18 DIAGNOSIS — N289 Disorder of kidney and ureter, unspecified: Secondary | ICD-10-CM

## 2019-01-18 DIAGNOSIS — E038 Other specified hypothyroidism: Secondary | ICD-10-CM | POA: Diagnosis not present

## 2019-01-18 DIAGNOSIS — E063 Autoimmune thyroiditis: Secondary | ICD-10-CM | POA: Diagnosis not present

## 2019-01-18 DIAGNOSIS — E7800 Pure hypercholesterolemia, unspecified: Secondary | ICD-10-CM | POA: Diagnosis not present

## 2019-01-18 DIAGNOSIS — Z86010 Personal history of colonic polyps: Secondary | ICD-10-CM

## 2019-01-18 DIAGNOSIS — Z131 Encounter for screening for diabetes mellitus: Secondary | ICD-10-CM

## 2019-01-18 DIAGNOSIS — E559 Vitamin D deficiency, unspecified: Secondary | ICD-10-CM | POA: Insufficient documentation

## 2019-01-18 MED ORDER — ATORVASTATIN CALCIUM 40 MG PO TABS: 40.0000 mg | ORAL_TABLET | Freq: Every day | ORAL | 3 refills | Status: DC

## 2019-01-18 NOTE — Progress Notes (Signed)
Virtual Visit via telephone Note  I connected with patient on 01/18/19 at 922am by video and verified that I am speaking with the correct person using two identifiers. Ann Miller is currently located at home and patient is currently with her during visit. The provider, Rutherford Guys, MD is located in their office at time of visit.  I discussed the limitations, risks, security and privacy concerns of performing an evaluation and management service by telephone and the availability of in person appointments. I also discussed with the patient that there may be a patient responsible charge related to this service. The patient expressed understanding and agreed to proceed.  Telephone visit today for CPE  HPI  57 yo Female who presents today for CPE  Last CPE April 2019 with Dr Tamala Julian Cervical Cancer Screening: 12/2017 neg pap and HPV, denies h/o previous abnormal paps Breast Cancer Screening: July 2019, negative, denies h/o abnormal or fhx Colorectal Cancer Screening: Sept 2019, sessile serrated polyp, repeat in 5 years Bone Density Testing: at age 39 HIV Screening: 2019 Pneumococcal Vaccination: at age 50 Zoster Vaccination: at pharmacy of choice Frequency of Dental evaluation: Q6 months Frequency of Eye evaluation: yearly  Has upcoming surgery for RIGHT partial nephrectomy Has stopped taking ca/vitamin D since Jan when she had her kidney stone   She eats healthy, mostly plant based, exercises for an hour 3-4 times a day  Lab Results  Component Value Date   CHOL 273 (H) 01/13/2019   HDL 42 01/13/2019   LDLCALC 208 (H) 01/13/2019   TRIG 113 01/13/2019   CHOLHDL 6.5 (H) 01/13/2019    BP Readings from Last 3 Encounters:  10/03/18 (!) 144/77  06/22/18 (!) 101/58  04/28/18 120/83   Wt Readings from Last 3 Encounters:  10/03/18 187 lb (84.8 kg)  06/22/18 187 lb 6.4 oz (85 kg)  06/08/18 187 lb 6.4 oz (85 kg)    Lab Results  Component Value Date   TSH 3.140 01/13/2019    Most Recent Immunizations  Administered Date(s) Administered  . Influenza Inj Mdck Quad Pf 07/17/2017  . Influenza-Unspecified 07/03/2018  . MMR 05/29/2009  . PPD Test 05/29/2009  . Td 02/14/2003  . Tdap 05/29/2009  ?  Fall Risk  01/18/2019 04/28/2018 01/24/2018 03/09/2017 03/05/2017  Falls in the past year? 0 No No No No     Depression screen [facility] 2/9 01/18/2019 04/28/2018 01/24/2018  Decreased Interest 0 0 0  Down, Depressed, Hopeless 0 0 0  PHQ - 2 Score 0 0 0    Allergies  Allergen Reactions  . Codeine Other (See Comments)    Throws it up    Prior to Admission medications   Medication Sig Start Date End Date Taking? Authorizing Provider  cholecalciferol (VITAMIN D) 1000 units tablet Take 4,000 Units by mouth daily.   Yes [provider]  levothyroxine (SYNTHROID, LEVOTHROID) 50 MCG tablet Take 1 tablet (50 mcg total) by mouth daily before breakfast. 03/27/18  Yes Wardell Honour, MD  Multiple Vitamins-Calcium (VIACTIV MULTI-VITAMIN) CHEW Chew 2 each by mouth 1 day or 1 dose.   Yes [provider]  Polyethyl Glycol-Propyl Glycol (SYSTANE) 0.4-0.3 % SOLN Apply 1-2 drops to eye as needed (dry eyes).    Yes [provider]    Past Medical History:  Diagnosis Date  . Hx of adenomatous colonic polyps 06/01/2013  . Irritable bowel syndrome (IBS)    diarrhea predominant  . Thyroid disease   . Thyroid nodule  Past Surgical History:  Procedure Laterality Date  . ANKLE FRACTURE SURGERY Left   . BIOPSY THYROID    . COLONOSCOPY    . WISDOM TOOTH EXTRACTION  1980    Social History   Tobacco Use  . Smoking status: Never Smoker  . Smokeless tobacco: Never Used  Substance Use Topics  . Alcohol use: Yes    Comment: 2 drinks a month    Family History  Problem Relation Age of Onset  . Diabetes Mother   . Hyperlipidemia Mother   . Hyperlipidemia Father   . Skin cancer Father        Basal cell; squamous cell  . Colon polyps Father   . Diabetes Sister    . Diabetes Maternal Grandmother   . Diabetes Maternal Grandfather   . Cancer Paternal Grandmother   . Cancer Paternal Grandfather   . Colon polyps Paternal Grandfather   . Breast cancer Neg Hx   . Rectal cancer Neg Hx   . Stomach cancer Neg Hx   . Colon cancer Neg Hx     Review of Systems  Constitutional: Negative for chills and fever.  Respiratory: Negative for cough and shortness of breath.   Cardiovascular: Negative for chest pain, palpitations and leg swelling.  Gastrointestinal: Negative for abdominal pain, nausea and vomiting.  All other systems reviewed and are negative.   Objective  Vitals as reported by the patient: none  There were no vitals filed for this visit.  ASSESSMENT and PLAN  1. Routine physical examination  HCM reviewed/discussed. Anticipatory guidance regarding healthy weight, lifestyle and choices given.   2. Pure hypercholesterolemia Not controlled. Worse. Starting atorvastatin given LDL > 190. Reviewed new meds r/se/b - Lipid panel; Future - Comprehensive metabolic panel; Future  3. Hypothyroidism due to Hashimoto's thyroiditis At goal. Continue current dose.  4. Renal lesion Plan for partial R nephrectomy this fall  5. Screening for diabetes mellitus - Hemoglobin A1c; Future  6. History of colonic polyps Next colonoscopy due on 2024  7. Vitamin D deficiency - Vitamin D, 25-hydroxy; Future  Other orders - atorvastatin (LIPITOR) 40 MG tablet; Take 1 tablet (40 mg total) by mouth daily.  FOLLOW-UP: 3 months for HLP with fasting labs 3-4 days prior appt   The above assessment and management plan was discussed with the patient. The patient verbalized understanding of and has agreed to the management plan. Patient is aware to call the clinic if symptoms persist or worsen. Patient is aware when to return to the clinic for a follow-up visit. Patient educated on when it is appropriate to go to the emergency department.    I provided 28  minutes of non-face-to-face time during this encounter.  Rutherford Guys, MD [facility] at [facility] [place], [place] [address] Ph.  [phone] Fax [phone]

## 2019-01-18 NOTE — Progress Notes (Signed)
Pt c/o CPE and would like to discuss blood work and levothyroxine to see if she still needs it. No travel.

## 2019-03-09 ENCOUNTER — Telehealth: Payer: Self-pay | Admitting: Family Medicine

## 2019-03-09 NOTE — Telephone Encounter (Signed)
Copied from [place] [id number]. Topic: General - Other >> Mar 09, 2019 12:41 PM Carolyn Stare wrote: Pt req refill on levothyroxine (SYNTHROID, LEVOTHROID) 50 MCG tablet  Was seen in April and had labs drawn

## 2019-03-10 ENCOUNTER — Other Ambulatory Visit: Payer: Self-pay

## 2019-03-10 DIAGNOSIS — E063 Autoimmune thyroiditis: Secondary | ICD-10-CM

## 2019-03-10 DIAGNOSIS — E038 Other specified hypothyroidism: Secondary | ICD-10-CM

## 2019-03-10 MED ORDER — LEVOTHYROXINE SODIUM 50 MCG PO TABS: 50.0000 ug | ORAL_TABLET | Freq: Every day | ORAL | 3 refills | Status: DC

## 2019-03-10 NOTE — Telephone Encounter (Signed)
Rx sent to pharmacy   

## 2019-03-22 ENCOUNTER — Other Ambulatory Visit: Payer: Self-pay | Admitting: Family Medicine

## 2019-03-22 DIAGNOSIS — Z1231 Encounter for screening mammogram for malignant neoplasm of breast: Secondary | ICD-10-CM

## 2019-04-10 ENCOUNTER — Other Ambulatory Visit: Payer: Self-pay

## 2019-04-10 ENCOUNTER — Ambulatory Visit (INDEPENDENT_AMBULATORY_CARE_PROVIDER_SITE_OTHER): Payer: 59 | Admitting: Family Medicine

## 2019-04-10 DIAGNOSIS — E559 Vitamin D deficiency, unspecified: Secondary | ICD-10-CM

## 2019-04-10 DIAGNOSIS — Z131 Encounter for screening for diabetes mellitus: Secondary | ICD-10-CM

## 2019-04-10 DIAGNOSIS — E7800 Pure hypercholesterolemia, unspecified: Secondary | ICD-10-CM

## 2019-04-11 LAB — COMPREHENSIVE METABOLIC PANEL
ALT: 25 IU/L (ref 0–32)
AST: 17 IU/L (ref 0–40)
Albumin/Globulin Ratio: 1.8 (ref 1.2–2.2)
Albumin: 4.3 g/dL (ref 3.8–4.9)
Alkaline Phosphatase: 94 IU/L (ref 39–117)
BUN/Creatinine Ratio: 15 (ref 9–23)
BUN: 13 mg/dL (ref 6–24)
Bilirubin Total: 0.9 mg/dL (ref 0.0–1.2)
CO2: 22 mmol/L (ref 20–29)
Calcium: 9.4 mg/dL (ref 8.7–10.2)
Chloride: 106 mmol/L (ref 96–106)
Creatinine, Ser: 0.85 mg/dL (ref 0.57–1.00)
GFR calc Af Amer: 89 mL/min/{1.73_m2} (ref >59)
GFR calc non Af Amer: 77 mL/min/{1.73_m2} (ref >59)
Globulin, Total: 2.4 g/dL (ref 1.5–4.5)
Glucose: 102 mg/dL — ABNORMAL HIGH (ref 65–99)
Potassium: 4.4 mmol/L (ref 3.5–5.2)
Sodium: 142 mmol/L (ref 134–144)
Total Protein: 6.7 g/dL (ref 6.0–8.5)

## 2019-04-11 LAB — HEMOGLOBIN A1C
Est. average glucose Bld gHb Est-mCnc: 114 mg/dL
Hgb A1c MFr Bld: 5.6 % (ref 4.8–5.6)

## 2019-04-11 LAB — LIPID PANEL
Chol/HDL Ratio: 3.5 ratio (ref 0.0–4.4)
Cholesterol, Total: 120 mg/dL (ref 100–199)
HDL: 34 mg/dL — ABNORMAL LOW (ref >39)
LDL Calculated: 70 mg/dL (ref 0–99)
Triglycerides: 78 mg/dL (ref 0–149)
VLDL Cholesterol Cal: 16 mg/dL (ref 5–40)

## 2019-04-11 LAB — VITAMIN D 25 HYDROXY (VIT D DEFICIENCY, FRACTURES): Vit D, 25-Hydroxy: 64.8 ng/mL (ref 30.0–100.0)

## 2019-04-17 ENCOUNTER — Encounter: Payer: Self-pay | Admitting: Family Medicine

## 2019-04-17 ENCOUNTER — Other Ambulatory Visit: Payer: Self-pay

## 2019-04-17 ENCOUNTER — Ambulatory Visit (INDEPENDENT_AMBULATORY_CARE_PROVIDER_SITE_OTHER): Payer: 59 | Admitting: Family Medicine

## 2019-04-17 VITALS — BP 118/76 | HR 83 | Temp 98.4°F | Ht 67.0 in | Wt 188.8 lb

## 2019-04-17 DIAGNOSIS — E7800 Pure hypercholesterolemia, unspecified: Secondary | ICD-10-CM | POA: Diagnosis not present

## 2019-04-17 DIAGNOSIS — E559 Vitamin D deficiency, unspecified: Secondary | ICD-10-CM | POA: Diagnosis not present

## 2019-04-17 DIAGNOSIS — N289 Disorder of kidney and ureter, unspecified: Secondary | ICD-10-CM | POA: Diagnosis not present

## 2019-04-17 NOTE — Progress Notes (Signed)
7/20/202010:41 AM  Ann Miller [birth year], 58 y.o., female [id number]  Chief Complaint  Patient presents with  . Follow-up    started medicaion for cholestorol 3 month ago, did labs last week. here to discuss    HPI:   Patient is a 58 y.o. female with past medical history significant for hypothyroidism, colonic polyps, IBS-D, HLP who presents today for followup up  [place] April 2020 Started atorvastatin 40mg  due to LDL 208  Sees urologist in mid august to reassess renal lesion  Tolerating atorvastatin well Denies any side effects  She has stopped taking her calcium/vitamin D supplements after having a kidney stones  Exercises daily  She has an upcoming mammogram this august  Lab Results  Component Value Date   CHOL 120 04/10/2019   HDL 34 (L) 04/10/2019   LDLCALC 70 04/10/2019   TRIG 78 04/10/2019   CHOLHDL 3.5 04/10/2019   Lab Results  Component Value Date   CREATININE 0.85 04/10/2019   BUN 13 04/10/2019   NA 142 04/10/2019   K 4.4 04/10/2019   CL 106 04/10/2019   CO2 22 04/10/2019   Lab Results  Component Value Date   ALT 25 04/10/2019   AST 17 04/10/2019   ALKPHOS 94 04/10/2019   BILITOT 0.9 04/10/2019   Vitamin D 64.8, stable  Lab Results  Component Value Date   HGBA1C 5.6 04/10/2019    Depression screen [facility] 2/9 04/17/2019 01/18/2019 04/28/2018  Decreased Interest 0 0 0  Down, Depressed, Hopeless 0 0 0  PHQ - 2 Score 0 0 0    Fall Risk  04/17/2019 01/18/2019 04/28/2018 01/24/2018 03/09/2017  Falls in the past year? 0 0 No No No  Number falls in past yr: 0 - - - -  Injury with Fall? 0 - - - -     Allergies  Allergen Reactions  . Codeine Other (See Comments)    Throws it up    Prior to Admission medications   Medication Sig Start Date End Date Taking? Authorizing Provider  atorvastatin (LIPITOR) 40 MG tablet Take 1 tablet (40 mg total) by mouth daily. 01/18/19  Yes Rutherford Guys, MD  levothyroxine (SYNTHROID) 50 MCG tablet Take 1 tablet  (50 mcg total) by mouth daily before breakfast. 03/10/19  Yes Rutherford Guys, MD  Polyethyl Glycol-Propyl Glycol (SYSTANE) 0.4-0.3 % SOLN Apply 1-2 drops to eye as needed (dry eyes).    Yes [provider]    Past Medical History:  Diagnosis Date  . Hx of adenomatous colonic polyps 06/01/2013  . Irritable bowel syndrome (IBS)    diarrhea predominant  . Thyroid disease   . Thyroid nodule     Past Surgical History:  Procedure Laterality Date  . ANKLE FRACTURE SURGERY Left   . BIOPSY THYROID    . COLONOSCOPY    . WISDOM TOOTH EXTRACTION  1980    Social History   Tobacco Use  . Smoking status: Never Smoker  . Smokeless tobacco: Never Used  Substance Use Topics  . Alcohol use: Yes    Comment: 2 drinks a month    Family History  Problem Relation Age of Onset  . Diabetes Mother   . Hyperlipidemia Mother   . Hyperlipidemia Father   . Skin cancer Father        Basal cell; squamous cell  . Colon polyps Father   . Diabetes Sister   . Diabetes Maternal Grandmother   . Diabetes Maternal Grandfather   . Cancer  Paternal Grandmother   . Cancer Paternal Grandfather   . Colon polyps Paternal Grandfather   . Breast cancer Neg Hx   . Rectal cancer Neg Hx   . Stomach cancer Neg Hx   . Colon cancer Neg Hx     ROS Per hpi  OBJECTIVE:  Today's Vitals   04/17/19 1038  BP: 118/76  Pulse: 83  Temp: 98.4 F (36.9 C)  TempSrc: Oral  SpO2: 95%  Weight: 188 lb 12.8 oz (85.6 kg)  Height: 5\' 7"  (1.702 m)   Body mass index is 29.57 kg/m.  Wt Readings from Last 3 Encounters:  04/17/19 188 lb 12.8 oz (85.6 kg)  10/03/18 187 lb (84.8 kg)  06/22/18 187 lb 6.4 oz (85 kg)    Physical Exam Vitals signs and nursing note reviewed.  Constitutional:      Appearance: She is well-developed.  HENT:     Head: Normocephalic and atraumatic.  Eyes:     General: No scleral icterus.    Conjunctiva/sclera: Conjunctivae normal.     Pupils: Pupils are equal, round, and reactive to  light.  Neck:     Musculoskeletal: Neck supple.  Pulmonary:     Effort: Pulmonary effort is normal.  Skin:    General: Skin is warm and dry.  Neurological:     Mental Status: She is alert and oriented to person, place, and time.     ASSESSMENT and PLAN  1. Pure hypercholesterolemia Controlled. Continue current regime.  - Comprehensive metabolic panel; Future  2. Vitamin D deficiency At goal. Has stopped her supplement, discussed dietary sources, recheck, consider vit D alone supplements if needed - VITAMIN D 25 Hydroxy (Vit-D Deficiency, Fractures); Future  3. Renal lesion Per urology  Return in about 6 months (around 10/18/2019).    Rutherford Guys, MD [facility] [place], [place] [address] Ph.  [phone] Fax [phone]

## 2019-04-17 NOTE — Patient Instructions (Signed)
° ° ° °  If you have lab work done today you will be contacted with your lab results within the next 2 weeks.  If you have not heard from us then please contact us. The fastest way to get your results is to register for My Chart. ° ° °IF you received an x-ray today, you will receive an invoice from [facility]. Please contact [facility] at [phone] with questions or concerns regarding your invoice.  ° °IF you received labwork today, you will receive an invoice from [facility]. Please contact [facility] at [phone] with questions or concerns regarding your invoice.  ° °Our billing staff will not be able to assist you with questions regarding bills from these companies. ° °You will be contacted with the lab results as soon as they are available. The fastest way to get your results is to activate your My Chart account. Instructions are located on the last page of this paperwork. If you have not heard from us regarding the results in 2 weeks, please contact this office. °  ° ° ° °

## 2019-05-05 ENCOUNTER — Ambulatory Visit
Admission: RE | Admit: 2019-05-05 | Discharge: 2019-05-05 | Disposition: A | Discharge status: Home / Self Care | Payer: Managed Care, Other (non HMO) | Source: Ambulatory Visit | Attending: Family Medicine | Admitting: Family Medicine

## 2019-05-05 ENCOUNTER — Other Ambulatory Visit: Payer: Self-pay

## 2019-05-05 DIAGNOSIS — Z1231 Encounter for screening mammogram for malignant neoplasm of breast: Secondary | ICD-10-CM

## 2019-05-15 ENCOUNTER — Other Ambulatory Visit: Payer: Self-pay | Admitting: Urology

## 2019-05-16 ENCOUNTER — Other Ambulatory Visit: Payer: Self-pay | Admitting: Urology

## 2019-05-30 DIAGNOSIS — C641 Malignant neoplasm of right kidney, except renal pelvis: Secondary | ICD-10-CM

## 2019-05-30 HISTORY — DX: Malignant neoplasm of right kidney, except renal pelvis: C64.1

## 2019-06-14 ENCOUNTER — Encounter (HOSPITAL_COMMUNITY): Payer: Self-pay

## 2019-06-14 NOTE — Patient Instructions (Addendum)
DUE TO COVID-19 ONLY ONE VISITOR IS ALLOWED TO COME WITH YOU AND STAY IN THE WAITING ROOM ONLY DURING PRE OP AND PROCEDURE. THE ONE VISITOR MAY VISIT WITH YOU IN YOUR PRIVATE ROOM DURING VISITING HOURS ONLY!!   COVID SWAB TESTING MUST BE COMPLETED ON: Tuesday, Sept. 22, 2020 at 3:05PM.   [address] [place] -[facility] enter pre surgical testing line (Must self quarantine after testing. Follow instructions on handout.)             Your procedure is scheduled on: Friday, Sept. 25, 2020   Report to [facility] Main  Entrance   Report to Short Stay at 5:30 AM   Call this number if you have problems the morning of surgery [phone]   Sept 24, 2020 start liquid diet   CLEAR LIQUID DIET  Foods Allowed                                                                     Foods Excluded  Water, Black Coffee and tea, regular and decaf                             liquids that you cannot  Plain Jell-O in any flavor  (No red)                                           see through such as: Fruit ices (not with fruit pulp)                                     milk, soups, orange juice  Iced Popsicles (No red)                                    All solid food Carbonated beverages, regular and diet                                    Apple juices Sports drinks like Gatorade (No red) Lightly seasoned clear broth or consume(fat free) Sugar, honey syrup  Sample Menu Breakfast                                Lunch                                     Supper Cranberry juice                    Beef broth                            Chicken broth Jell-O  Grape juice                           Apple juice Coffee or tea                        Jell-O                                      Popsicle                                                Coffee or tea                        Coffee or tea   Magnesium Citrate:  Drink one bottle by noon  the day before surgery.   Do not eat food or drink liquids :After Midnight.   Brush your teeth the morning of surgery.   Do NOT smoke after Midnight   Take these medicines the morning of surgery with A SIP OF WATER: Levothyroxine                               You may not have any metal on your body including hair pins, jewelry, and body piercings             Do not wear make-up, lotions, powders, perfumes/cologne, or deodorant             Do not wear nail polish.  Do not shave  48 hours prior to surgery.              Do not bring valuables to the hospital. [place].   Contacts, dentures or bridgework may not be worn into surgery.   Bring small overnight bag day of surgery.    Special Instructions: Bring a copy of your healthcare power of attorney and living will documents         the day of surgery if you haven't scanned them in before.              Please read over the following fact sheets you were given:  [facility] - Preparing for Surgery Before surgery, you can play an important role.  Because skin is not sterile, your skin needs to be as free of germs as possible.  You can reduce the number of germs on your skin by washing with CHG (chlorahexidine gluconate) soap before surgery.  CHG is an antiseptic cleaner which kills germs and bonds with the skin to continue killing germs even after washing. Please DO NOT use if you have an allergy to CHG or antibacterial soaps.  If your skin becomes reddened/irritated stop using the CHG and inform your nurse when you arrive at Short Stay. Do not shave (including legs and underarms) for at least 48 hours prior to the first CHG shower.  You may shave your face/neck.  Please follow these instructions carefully:  1.  Shower with CHG Soap the night before surgery and the  morning of surgery.  2.  If you choose to wash your hair, wash your hair first as usual with your normal  shampoo.  3.  After  you shampoo, rinse your hair and body thoroughly to remove the shampoo.                             4.  Use CHG as you would any other liquid soap.  You can apply chg directly to the skin and wash.  Gently with a scrungie or clean washcloth.  5.  Apply the CHG Soap to your body ONLY FROM THE NECK DOWN.   Do   not use on face/ open                           Wound or open sores. Avoid contact with eyes, ears mouth and   genitals (private parts).                       Wash face,  Genitals (private parts) with your normal soap.             6.  Wash thoroughly, paying special attention to the area where your    surgery  will be performed.  7.  Thoroughly rinse your body with warm water from the neck down.  8.  DO NOT shower/wash with your normal soap after using and rinsing off the CHG Soap.                9.  Pat yourself dry with a clean towel.            10.  Wear clean pajamas.            11.  Place clean sheets on your bed the night of your first shower and do not  sleep with pets. Day of Surgery : Do not apply any lotions/deodorants the morning of surgery.  Please wear clean clothes to the hospital/surgery center.  FAILURE TO FOLLOW THESE INSTRUCTIONS MAY RESULT IN THE CANCELLATION OF YOUR SURGERY  PATIENT SIGNATURE_________________________________  NURSE SIGNATURE__________________________________  ________________________________________________________________________

## 2019-06-15 ENCOUNTER — Encounter (HOSPITAL_COMMUNITY)
Admission: RE | Admit: 2019-06-15 | Discharge: 2019-06-15 | Disposition: A | Discharge status: Home / Self Care | Payer: Managed Care, Other (non HMO) | Source: Ambulatory Visit | Attending: Urology | Admitting: Urology

## 2019-06-15 ENCOUNTER — Encounter (HOSPITAL_COMMUNITY): Payer: Self-pay

## 2019-06-15 ENCOUNTER — Other Ambulatory Visit: Payer: Self-pay

## 2019-06-15 DIAGNOSIS — N2889 Other specified disorders of kidney and ureter: Secondary | ICD-10-CM | POA: Insufficient documentation

## 2019-06-15 DIAGNOSIS — Z01812 Encounter for preprocedural laboratory examination: Secondary | ICD-10-CM | POA: Diagnosis present

## 2019-06-15 HISTORY — DX: Pure hypercholesterolemia, unspecified: E78.00

## 2019-06-15 HISTORY — DX: Hypothyroidism, unspecified: E03.9

## 2019-06-15 HISTORY — DX: Vitamin D deficiency, unspecified: E55.9

## 2019-06-15 LAB — CBC
HCT: 44 % (ref 36.0–46.0)
Hemoglobin: 14.4 g/dL (ref 12.0–15.0)
MCH: 29.4 pg (ref 26.0–34.0)
MCHC: 32.7 g/dL (ref 30.0–36.0)
MCV: 89.8 fL (ref 80.0–100.0)
Platelets: 209 10*3/uL (ref 150–400)
RBC: 4.9 MIL/uL (ref 3.87–5.11)
RDW: 12.5 % (ref 11.5–15.5)
WBC: 5.1 10*3/uL (ref 4.0–10.5)
nRBC: 0 % (ref 0.0–0.2)

## 2019-06-15 MED ORDER — MAGNESIUM CITRATE PO SOLN
1.0000 | Freq: Once | ORAL | Status: DC
  Filled 2019-06-15: qty 296

## 2019-06-15 NOTE — Progress Notes (Signed)
PCP - Dr. Grant Fontana Last office visit 04/17/2019 in epic Cardiologist - N/A  Chest x-ray - N/A EKG - N/A Stress Test - N/A ECHO - N/A Cardiac Cath - N/A  Sleep Study - N/A CPAP - N/A  Fasting Blood Sugar - N/A Checks Blood Sugar _N/A____ times a day  Blood Thinner Instructions:N/A Aspirin Instructions:N/A Last Dose:N/A  Anesthesia review: N/A  Patient denies shortness of breath, fever, cough and chest pain at PAT appointment   Patient verbalized understanding of instructions that were given to them at the PAT appointment. Patient was also instructed that they will need to review over the PAT instructions again at home before surgery.

## 2019-06-15 NOTE — Progress Notes (Signed)
SPOKE W/  Lorelee     SCREENING SYMPTOMS OF COVID 19:   COUGH--NO  RUNNY NOSE--- NO  SORE THROAT---  NASAL CONGESTION----NO  SNEEZING----NO  SHORTNESS OF BREATH---NO  DIFFICULTY BREATHING---NO  TEMP >100.0 -----NO  UNEXPLAINED BODY ACHES------NO  CHILLS -------- NO  HEADACHES ---------NO  LOSS OF SMELL/ TASTE --------NO    HAVE YOU OR ANY FAMILY MEMBER TRAVELLED PAST 14 DAYS OUT OF THE   COUNTY---NO STATE----NO COUNTRY----NO  HAVE YOU OR ANY FAMILY MEMBER BEEN EXPOSED TO ANYONE WITH COVID 19? NO

## 2019-06-20 ENCOUNTER — Other Ambulatory Visit (HOSPITAL_COMMUNITY)
Admission: RE | Admit: 2019-06-20 | Discharge: 2019-06-20 | Disposition: A | Discharge status: Home / Self Care | Payer: Managed Care, Other (non HMO) | Source: Ambulatory Visit | Attending: Urology | Admitting: Urology

## 2019-06-20 DIAGNOSIS — Z20828 Contact with and (suspected) exposure to other viral communicable diseases: Secondary | ICD-10-CM | POA: Diagnosis not present

## 2019-06-20 DIAGNOSIS — Z01812 Encounter for preprocedural laboratory examination: Secondary | ICD-10-CM | POA: Diagnosis present

## 2019-06-21 LAB — NOVEL CORONAVIRUS, NAA (HOSP ORDER, SEND-OUT TO REF LAB; TAT 18-24 HRS): SARS-CoV-2, NAA: NOT DETECTED

## 2019-06-22 NOTE — Anesthesia Preprocedure Evaluation (Addendum)
Anesthesia Evaluation  Patient identified by MRN, date of birth, ID band Patient awake    Reviewed: Allergy & Precautions, NPO status , Patient's Chart, lab work & pertinent test results  History of Anesthesia Complications Negative for: history of anesthetic complications  Airway Mallampati: II  TM Distance: >3 FB Neck ROM: Full    Dental  (+) Teeth Intact   Pulmonary neg pulmonary ROS,    Pulmonary exam normal        Cardiovascular negative cardio ROS Normal cardiovascular exam     Neuro/Psych negative neurological ROS  negative psych ROS   GI/Hepatic negative GI ROS, Neg liver ROS,   Endo/Other  Hypothyroidism   Renal/GU Renal disease (right renal mass)  negative genitourinary   Musculoskeletal negative musculoskeletal ROS (+)   Abdominal   Peds  Hematology negative hematology ROS (+)   Anesthesia Other Findings HLD  Reproductive/Obstetrics                            Anesthesia Physical Anesthesia Plan  ASA: II  Anesthesia Plan: General   Post-op Pain Management:    Induction: Intravenous  PONV Risk Score and Plan: Ondansetron, Dexamethasone, Treatment may vary due to age or medical condition and Midazolam  Airway Management Planned: Oral ETT  Additional Equipment: None  Intra-op Plan:   Post-operative Plan: Extubation in OR  Informed Consent: I have reviewed the patients History and Physical, chart, labs and discussed the procedure including the risks, benefits and alternatives for the proposed anesthesia with the patient or authorized representative who has indicated his/her understanding and acceptance.     Dental advisory given  Plan Discussed with:   Anesthesia Plan Comments: (PIV x2)       Anesthesia Quick Evaluation

## 2019-06-23 ENCOUNTER — Ambulatory Visit (HOSPITAL_COMMUNITY): Payer: Managed Care, Other (non HMO) | Admitting: Physician Assistant

## 2019-06-23 ENCOUNTER — Encounter (HOSPITAL_COMMUNITY): Payer: Self-pay | Admitting: *Deleted

## 2019-06-23 ENCOUNTER — Observation Stay (HOSPITAL_COMMUNITY)
Admission: RE | Admit: 2019-06-23 | Discharge: 2019-06-24 | Disposition: A | Discharge status: Home / Self Care | Payer: Managed Care, Other (non HMO) | Attending: Urology | Admitting: Urology

## 2019-06-23 ENCOUNTER — Ambulatory Visit (HOSPITAL_COMMUNITY): Payer: Managed Care, Other (non HMO) | Admitting: Anesthesiology

## 2019-06-23 ENCOUNTER — Other Ambulatory Visit: Payer: Self-pay

## 2019-06-23 ENCOUNTER — Encounter (HOSPITAL_COMMUNITY)
Admission: RE | Disposition: A | Discharge status: Home / Self Care | Payer: Self-pay | Source: Home / Self Care | Attending: Urology

## 2019-06-23 DIAGNOSIS — C641 Malignant neoplasm of right kidney, except renal pelvis: Secondary | ICD-10-CM | POA: Diagnosis not present

## 2019-06-23 DIAGNOSIS — Z79890 Hormone replacement therapy (postmenopausal): Secondary | ICD-10-CM | POA: Insufficient documentation

## 2019-06-23 DIAGNOSIS — E7800 Pure hypercholesterolemia, unspecified: Secondary | ICD-10-CM | POA: Insufficient documentation

## 2019-06-23 DIAGNOSIS — N2889 Other specified disorders of kidney and ureter: Secondary | ICD-10-CM | POA: Diagnosis present

## 2019-06-23 DIAGNOSIS — E039 Hypothyroidism, unspecified: Secondary | ICD-10-CM | POA: Insufficient documentation

## 2019-06-23 DIAGNOSIS — Z79899 Other long term (current) drug therapy: Secondary | ICD-10-CM | POA: Diagnosis not present

## 2019-06-23 HISTORY — PX: ROBOTIC ASSITED PARTIAL NEPHRECTOMY: SHX6087

## 2019-06-23 LAB — HEMOGLOBIN AND HEMATOCRIT, BLOOD
HCT: 40.4 % (ref 36.0–46.0)
Hemoglobin: 13.1 g/dL (ref 12.0–15.0)

## 2019-06-23 LAB — SAMPLE TO BLOOD BANK

## 2019-06-23 LAB — ABO/RH: ABO/RH(D): O POS

## 2019-06-23 SURGERY — NEPHRECTOMY, PARTIAL, ROBOT-ASSISTED
Anesthesia: General | Laterality: Right

## 2019-06-23 MED ORDER — CHLORHEXIDINE GLUCONATE CLOTH 2 % EX PADS
6.0000 | MEDICATED_PAD | Freq: Every day | CUTANEOUS | Status: DC
  Administered 2019-06-24: 6 via TOPICAL

## 2019-06-23 MED ORDER — TRAMADOL HCL 50 MG PO TABS: 50.0000 mg | ORAL_TABLET | Freq: Four times a day (QID) | ORAL | 0 refills | Status: DC | PRN

## 2019-06-23 MED ORDER — LIDOCAINE 2% (20 MG/ML) 5 ML SYRINGE
INTRAMUSCULAR | Status: DC | PRN
  Administered 2019-06-23: 100 mg via INTRAVENOUS

## 2019-06-23 MED ORDER — OXYCODONE HCL 5 MG/5ML PO SOLN: 5.0000 mg | Freq: Once | ORAL | Status: DC | PRN

## 2019-06-23 MED ORDER — DIPHENHYDRAMINE HCL 50 MG/ML IJ SOLN: 12.5000 mg | Freq: Four times a day (QID) | INTRAMUSCULAR | Status: DC | PRN

## 2019-06-23 MED ORDER — DEXTROSE-NACL 5-0.45 % IV SOLN
INTRAVENOUS | Status: DC
  Administered 2019-06-23 – 2019-06-24 (×2): via INTRAVENOUS

## 2019-06-23 MED ORDER — LIDOCAINE HCL 2 % IJ SOLN
INTRAMUSCULAR | Status: AC
  Filled 2019-06-23: qty 20

## 2019-06-23 MED ORDER — HYDROMORPHONE HCL 1 MG/ML IJ SOLN
0.5000 mg | INTRAMUSCULAR | Status: DC | PRN
  Administered 2019-06-23: 13:00:00 0.5 mg via INTRAVENOUS

## 2019-06-23 MED ORDER — DEXAMETHASONE SODIUM PHOSPHATE 10 MG/ML IJ SOLN
INTRAMUSCULAR | Status: AC
  Filled 2019-06-23: qty 1

## 2019-06-23 MED ORDER — DEXAMETHASONE SODIUM PHOSPHATE 10 MG/ML IJ SOLN
INTRAMUSCULAR | Status: DC | PRN
  Administered 2019-06-23: 10 mg via INTRAVENOUS

## 2019-06-23 MED ORDER — LACTATED RINGERS IV SOLN
INTRAVENOUS | Status: DC
  Administered 2019-06-23 (×2): via INTRAVENOUS

## 2019-06-23 MED ORDER — CEFAZOLIN SODIUM-DEXTROSE 2-4 GM/100ML-% IV SOLN
2.0000 g | INTRAVENOUS | Status: AC
  Administered 2019-06-23: 08:00:00 2 g via INTRAVENOUS
  Filled 2019-06-23: qty 100

## 2019-06-23 MED ORDER — PROPOFOL 10 MG/ML IV BOLUS
INTRAVENOUS | Status: DC | PRN
  Administered 2019-06-23: 150 mg via INTRAVENOUS

## 2019-06-23 MED ORDER — FENTANYL CITRATE (PF) 100 MCG/2ML IJ SOLN
25.0000 ug | INTRAMUSCULAR | Status: DC | PRN
  Administered 2019-06-23 (×3): 50 ug via INTRAVENOUS

## 2019-06-23 MED ORDER — SUGAMMADEX SODIUM 200 MG/2ML IV SOLN
INTRAVENOUS | Status: DC | PRN
  Administered 2019-06-23: 200 mg via INTRAVENOUS

## 2019-06-23 MED ORDER — FENTANYL CITRATE (PF) 250 MCG/5ML IJ SOLN
INTRAMUSCULAR | Status: DC | PRN
  Administered 2019-06-23: 50 ug via INTRAVENOUS
  Administered 2019-06-23: 100 ug via INTRAVENOUS
  Administered 2019-06-23 (×2): 50 ug via INTRAVENOUS

## 2019-06-23 MED ORDER — ONDANSETRON HCL 4 MG/2ML IJ SOLN
INTRAMUSCULAR | Status: DC | PRN
  Administered 2019-06-23: 4 mg via INTRAVENOUS

## 2019-06-23 MED ORDER — SODIUM CHLORIDE (PF) 0.9 % IJ SOLN
INTRAMUSCULAR | Status: DC | PRN
  Administered 2019-06-23: 20 mL

## 2019-06-23 MED ORDER — SODIUM CHLORIDE (PF) 0.9 % IJ SOLN
INTRAMUSCULAR | Status: AC
  Filled 2019-06-23: qty 20

## 2019-06-23 MED ORDER — ONDANSETRON HCL 4 MG/2ML IJ SOLN
4.0000 mg | INTRAMUSCULAR | Status: DC | PRN
  Administered 2019-06-23: 4 mg via INTRAVENOUS
  Filled 2019-06-23: qty 2

## 2019-06-23 MED ORDER — HYDROMORPHONE HCL 1 MG/ML IJ SOLN
INTRAMUSCULAR | Status: AC
  Administered 2019-06-23: 0.5 mg via INTRAVENOUS
  Filled 2019-06-23: qty 2

## 2019-06-23 MED ORDER — HYDROMORPHONE HCL 1 MG/ML IJ SOLN
0.2500 mg | INTRAMUSCULAR | Status: DC | PRN
  Administered 2019-06-23: 0.5 mg via INTRAVENOUS

## 2019-06-23 MED ORDER — TRAMADOL HCL 50 MG PO TABS: 50.0000 mg | ORAL_TABLET | Freq: Four times a day (QID) | ORAL | Status: DC | PRN

## 2019-06-23 MED ORDER — PROPOFOL 10 MG/ML IV BOLUS
INTRAVENOUS | Status: AC
  Filled 2019-06-23: qty 20

## 2019-06-23 MED ORDER — ACETAMINOPHEN 500 MG PO TABS
1000.0000 mg | ORAL_TABLET | Freq: Four times a day (QID) | ORAL | Status: AC
  Administered 2019-06-23 – 2019-06-24 (×4): 1000 mg via ORAL
  Filled 2019-06-23 (×4): qty 2

## 2019-06-23 MED ORDER — FENTANYL CITRATE (PF) 250 MCG/5ML IJ SOLN
INTRAMUSCULAR | Status: AC
  Filled 2019-06-23: qty 5

## 2019-06-23 MED ORDER — MIDAZOLAM HCL 2 MG/2ML IJ SOLN
INTRAMUSCULAR | Status: DC | PRN
  Administered 2019-06-23: 2 mg via INTRAVENOUS

## 2019-06-23 MED ORDER — BUPIVACAINE LIPOSOME 1.3 % IJ SUSP
20.0000 mL | Freq: Once | INTRAMUSCULAR | Status: AC
  Administered 2019-06-23: 20 mL
  Filled 2019-06-23: qty 20

## 2019-06-23 MED ORDER — BELLADONNA ALKALOIDS-OPIUM 16.2-60 MG RE SUPP: 1.0000 | Freq: Four times a day (QID) | RECTAL | Status: DC | PRN

## 2019-06-23 MED ORDER — ROCURONIUM BROMIDE 10 MG/ML (PF) SYRINGE
PREFILLED_SYRINGE | INTRAVENOUS | Status: AC
  Filled 2019-06-23: qty 10

## 2019-06-23 MED ORDER — DIPHENHYDRAMINE HCL 12.5 MG/5ML PO ELIX: 12.5000 mg | ORAL_SOLUTION | Freq: Four times a day (QID) | ORAL | Status: DC | PRN

## 2019-06-23 MED ORDER — LEVOTHYROXINE SODIUM 50 MCG PO TABS
50.0000 ug | ORAL_TABLET | Freq: Every day | ORAL | Status: DC
  Administered 2019-06-24: 50 ug via ORAL
  Filled 2019-06-23: qty 1

## 2019-06-23 MED ORDER — ROCURONIUM BROMIDE 10 MG/ML (PF) SYRINGE
PREFILLED_SYRINGE | INTRAVENOUS | Status: DC | PRN
  Administered 2019-06-23: 10 mg via INTRAVENOUS
  Administered 2019-06-23: 20 mg via INTRAVENOUS
  Administered 2019-06-23: 70 mg via INTRAVENOUS

## 2019-06-23 MED ORDER — FENTANYL CITRATE (PF) 100 MCG/2ML IJ SOLN
INTRAMUSCULAR | Status: AC
  Administered 2019-06-23: 50 ug via INTRAVENOUS
  Filled 2019-06-23: qty 4

## 2019-06-23 MED ORDER — OXYCODONE HCL 5 MG PO TABS: 5.0000 mg | ORAL_TABLET | Freq: Once | ORAL | Status: DC | PRN

## 2019-06-23 MED ORDER — ONDANSETRON HCL 4 MG/2ML IJ SOLN
INTRAMUSCULAR | Status: AC
  Filled 2019-06-23: qty 2

## 2019-06-23 MED ORDER — LIDOCAINE 20MG/ML (2%) 15 ML SYRINGE OPTIME
INTRAMUSCULAR | Status: DC | PRN
  Administered 2019-06-23: 1.5 mg/kg/h via INTRAVENOUS

## 2019-06-23 MED ORDER — MIDAZOLAM HCL 2 MG/2ML IJ SOLN
INTRAMUSCULAR | Status: AC
  Filled 2019-06-23: qty 2

## 2019-06-23 MED ORDER — ONDANSETRON HCL 4 MG/2ML IJ SOLN: 4.0000 mg | Freq: Once | INTRAMUSCULAR | Status: DC | PRN

## 2019-06-23 MED ORDER — LIDOCAINE 2% (20 MG/ML) 5 ML SYRINGE
INTRAMUSCULAR | Status: AC
  Filled 2019-06-23: qty 5

## 2019-06-23 MED ORDER — LACTATED RINGERS IV SOLN
INTRAVENOUS | Status: DC | PRN
  Administered 2019-06-23: 07:00:00 via INTRAVENOUS

## 2019-06-23 MED ORDER — DOCUSATE SODIUM 100 MG PO CAPS
100.0000 mg | ORAL_CAPSULE | Freq: Two times a day (BID) | ORAL | Status: DC
  Administered 2019-06-23 – 2019-06-24 (×3): 100 mg via ORAL
  Filled 2019-06-23 (×3): qty 1

## 2019-06-23 MED ORDER — ATORVASTATIN CALCIUM 40 MG PO TABS
40.0000 mg | ORAL_TABLET | Freq: Every evening | ORAL | Status: DC
  Administered 2019-06-23: 40 mg via ORAL
  Filled 2019-06-23: qty 1

## 2019-06-23 SURGICAL SUPPLY — 74 items
APPLICATOR SURGIFLO ENDO (HEMOSTASIS) ×3 IMPLANT
CHLORAPREP W/TINT 26 (MISCELLANEOUS) ×3 IMPLANT
CLIP SUT LAPRA TY ABSORB (SUTURE) ×5 IMPLANT
CLIP VESOLOCK LG 6/CT PURPLE (CLIP) ×5 IMPLANT
CLIP VESOLOCK MED LG 6/CT (CLIP) ×6 IMPLANT
CLIP VESOLOCK XL 6/CT (CLIP) IMPLANT
COVER SURGICAL LIGHT HANDLE (MISCELLANEOUS) ×3 IMPLANT
COVER TIP SHEARS 8 DVNC (MISCELLANEOUS) ×1 IMPLANT
COVER TIP SHEARS 8MM DA VINCI (MISCELLANEOUS) ×2
COVER WAND RF STERILE (DRAPES) IMPLANT
DECANTER SPIKE VIAL GLASS SM (MISCELLANEOUS) ×3 IMPLANT
DERMABOND ADVANCED (GAUZE/BANDAGES/DRESSINGS) ×2
DERMABOND ADVANCED .7 DNX12 (GAUZE/BANDAGES/DRESSINGS) ×1 IMPLANT
DRAIN CHANNEL 15F RND FF 3/16 (WOUND CARE) ×3 IMPLANT
DRAPE ARM DVNC X/XI (DISPOSABLE) ×4 IMPLANT
DRAPE COLUMN DVNC XI (DISPOSABLE) ×1 IMPLANT
DRAPE DA VINCI XI ARM (DISPOSABLE) ×8
DRAPE DA VINCI XI COLUMN (DISPOSABLE) ×2
DRAPE INCISE IOBAN 66X45 STRL (DRAPES) ×3 IMPLANT
DRAPE SHEET LG 3/4 BI-LAMINATE (DRAPES) ×3 IMPLANT
DRSG TEGADERM 4X4.75 (GAUZE/BANDAGES/DRESSINGS) ×2 IMPLANT
ELECT PENCIL ROCKER SW 15FT (MISCELLANEOUS) ×3 IMPLANT
ELECT REM PT RETURN 15FT ADLT (MISCELLANEOUS) ×3 IMPLANT
EVACUATOR SILICONE 100CC (DRAIN) ×3 IMPLANT
GAUZE SPONGE 2X2 8PLY STRL LF (GAUZE/BANDAGES/DRESSINGS) IMPLANT
GLOVE BIO SURGEON STRL SZ 6.5 (GLOVE) ×4 IMPLANT
GLOVE BIO SURGEONS STRL SZ 6.5 (GLOVE) ×3
GLOVE BIOGEL M STRL SZ7.5 (GLOVE) ×6 IMPLANT
GOWN STRL REUS W/TWL LRG LVL3 (GOWN DISPOSABLE) ×10 IMPLANT
HEMOSTAT SURGICEL 4X8 (HEMOSTASIS) ×3 IMPLANT
HOLDER FOLEY CATH W/STRAP (MISCELLANEOUS) ×2 IMPLANT
IRRIG SUCT STRYKERFLOW 2 WTIP (MISCELLANEOUS) ×3
IRRIGATION SUCT STRKRFLW 2 WTP (MISCELLANEOUS) ×1 IMPLANT
KIT BASIN OR (CUSTOM PROCEDURE TRAY) ×3 IMPLANT
KIT TURNOVER KIT A (KITS) ×2 IMPLANT
LOOP VESSEL MAXI BLUE (MISCELLANEOUS) ×3 IMPLANT
MARKER SKIN DUAL TIP RULER LAB (MISCELLANEOUS) ×3 IMPLANT
NDL INSUFFLATION 14GA 120MM (NEEDLE) ×1 IMPLANT
NEEDLE INSUFFLATION 14GA 120MM (NEEDLE) ×3 IMPLANT
NS IRRIG 1000ML POUR BTL (IV SOLUTION) ×1 IMPLANT
PORT ACCESS TROCAR AIRSEAL 12 (TROCAR) ×1 IMPLANT
PORT ACCESS TROCAR AIRSEAL 5M (TROCAR)
POUCH SPECIMEN RETRIEVAL 10MM (ENDOMECHANICALS) ×3 IMPLANT
PROTECTOR NERVE ULNAR (MISCELLANEOUS) ×8 IMPLANT
RELOAD STAPLE 60 2.6 WHT THN (STAPLE) IMPLANT
RELOAD STAPLER WHITE 60MM (STAPLE) IMPLANT
SEAL CANN UNIV 5-8 DVNC XI (MISCELLANEOUS) ×4 IMPLANT
SEAL XI 5MM-8MM UNIVERSAL (MISCELLANEOUS) ×8
SET TRI-LUMEN FLTR TB AIRSEAL (TUBING) ×1 IMPLANT
SOLUTION ELECTROLUBE (MISCELLANEOUS) ×3 IMPLANT
SPONGE GAUZE 2X2 STER 10/PKG (GAUZE/BANDAGES/DRESSINGS) ×2
SPONGE LAP 4X18 RFD (DISPOSABLE) ×3 IMPLANT
STAPLE ECHEON FLEX 60 POW ENDO (STAPLE) IMPLANT
STAPLER RELOAD WHITE 60MM (STAPLE)
SURGIFLO W/THROMBIN 8M KIT (HEMOSTASIS) ×3 IMPLANT
SUT ETHILON 3 0 PS 1 (SUTURE) ×3 IMPLANT
SUT MNCRL AB 4-0 PS2 18 (SUTURE) ×6 IMPLANT
SUT PDS AB 1 CT1 27 (SUTURE) ×6 IMPLANT
SUT V-LOC BARB 180 2/0GR6 GS22 (SUTURE)
SUT VIC AB 0 CT1 27 (SUTURE) ×8
SUT VIC AB 0 CT1 27XBRD ANTBC (SUTURE) ×4 IMPLANT
SUT VIC AB 2-0 SH 27 (SUTURE) ×4
SUT VIC AB 2-0 SH 27X BRD (SUTURE) ×2 IMPLANT
SUT VLOC BARB 180 ABS3/0GR12 (SUTURE) ×3
SUTURE V-LC BRB 180 2/0GR6GS22 (SUTURE) IMPLANT
SUTURE VLOC BRB 180 ABS3/0GR12 (SUTURE) ×1 IMPLANT
TOWEL OR 17X26 10 PK STRL BLUE (TOWEL DISPOSABLE) ×4 IMPLANT
TOWEL OR NON WOVEN STRL DISP B (DISPOSABLE) ×3 IMPLANT
TRAY FOLEY MTR SLVR 16FR STAT (SET/KITS/TRAYS/PACK) ×1 IMPLANT
TRAY LAPAROSCOPIC (CUSTOM PROCEDURE TRAY) ×3 IMPLANT
TROCAR BLADELESS OPT 5 100 (ENDOMECHANICALS) ×2 IMPLANT
TROCAR XCEL 12X100 BLDLESS (ENDOMECHANICALS) ×3 IMPLANT
TUBING INSUF HEATED (TUBING) ×2 IMPLANT
WATER STERILE IRR 1000ML POUR (IV SOLUTION) ×4 IMPLANT

## 2019-06-23 NOTE — Brief Op Note (Signed)
06/23/2019  10:15 AM  PATIENT:  Ann Miller  58 y.o. female  PRE-OPERATIVE DIAGNOSIS:  RIGHT RENAL MASS  POST-OPERATIVE DIAGNOSIS:  RIGHT RENAL MASS  PROCEDURE:  Procedure(s) with comments: XI ROBOTIC ASSITED PARTIAL NEPHRECTOMY (Right) - 3 HRS  SURGEON:  Surgeon(s) and Role:    Alexis Frock, MD - Primary  PHYSICIAN ASSISTANT:   ASSISTANTS: Clemetine Marker PA   ANESTHESIA:   local and general  EBL:  50 mL   BLOOD ADMINISTERED:none  DRAINS: 1 - JP to bulb; 2 - Foley to gravity   LOCAL MEDICATIONS USED:  MARCAINE     SPECIMEN:  Source of Specimen:  1 - Rt partial nephrectomy; 2 - Final margin  DISPOSITION OF SPECIMEN:  PATHOLOGY  COUNTS:  YES  TOURNIQUET:  * No tourniquets in log *  DICTATION: .Other Dictation: Dictation Number  [id number]  PLAN OF CARE: Admit to inpatient   PATIENT DISPOSITION:  PACU - hemodynamically stable.   Delay start of Pharmacological VTE agent (>24hrs) due to surgical blood loss or risk of bleeding: yes

## 2019-06-23 NOTE — Transfer of Care (Signed)
Immediate Anesthesia Transfer of Care Note  Patient: Ann Miller  Procedure(s) Performed: XI ROBOTIC ASSITED PARTIAL NEPHRECTOMY (Right )  Patient Location: PACU  Anesthesia Type:General  Level of Consciousness: awake, drowsy and patient cooperative  Airway & Oxygen Therapy: Patient Spontanous Breathing and Patient connected to face mask oxygen  Post-op Assessment: Report given to RN and Post -op Vital signs reviewed and stable  Post vital signs: Reviewed and stable  Last Vitals:  Vitals Value Taken Time  BP 136/77 06/23/19 1026  Temp    Pulse 71 06/23/19 1027  Resp 14 06/23/19 1027  SpO2 100 % 06/23/19 1027  Vitals shown include unvalidated device data.  Last Pain:  Vitals:   06/23/19 0624  TempSrc:   PainSc: 0-No pain      Patients Stated Pain Goal: 3 ([id number])  Complications: No apparent anesthesia complications

## 2019-06-23 NOTE — Anesthesia Postprocedure Evaluation (Signed)
Anesthesia Post Note  Patient: Ann Miller  Procedure(s) Performed: XI ROBOTIC ASSITED PARTIAL NEPHRECTOMY (Right )     Patient location during evaluation: PACU Anesthesia Type: General Level of consciousness: awake and alert Pain management: pain level controlled Vital Signs Assessment: post-procedure vital signs reviewed and stable Respiratory status: spontaneous breathing, nonlabored ventilation, respiratory function stable and patient connected to nasal cannula oxygen Cardiovascular status: blood pressure returned to baseline and stable Postop Assessment: no apparent nausea or vomiting Anesthetic complications: no    Last Vitals:  Vitals:   06/23/19 1230 06/23/19 1335  BP: 118/60 118/67  Pulse: 60 67  Resp: 19 17  Temp: 36.6 C (!) 36.4 C  SpO2: 99% 100%    Last Pain:  Vitals:   06/23/19 1335  TempSrc: Oral  PainSc:                  Lidia Collum

## 2019-06-23 NOTE — H&P (Signed)
Ann Miller is an 58 y.o. female.    Chief Complaint: Pre-OP RIGHT Partial Nephrectomy  HPI:  1 - Small RIGHT Renal Mass - 2.3cm Rt mid lateral mass incidetnal on Stone CT then dedicated MRI 2020. Mass is 2.3cm, 50% exophytic, 1 artery (early upper pole branch) / 1 vein right renovascular anatomy. No contralateral or additional lesions. Mass enhancing, but not extremely avid.   PMH sig for ankle surgery, mild obesity. NO CV disease / blood thinners. Her PCP is Ann Fontana MD.   Today "Ann Miller" is seen to proceed with RIGHT partial nephrectomy.     Past Medical History:  Diagnosis Date  . History of kidney stones 09/2018  . Hx of adenomatous colonic polyps 06/01/2013  . Hypercholesterolemia   . Hypothyroidism   . Irritable bowel syndrome (IBS)    diarrhea predominant  . Renal lesion   . Thyroid nodule    follow up with biopsy no longer needs folllow up at this time  . Vitamin D deficiency    resolved    Past Surgical History:  Procedure Laterality Date  . ANKLE FRACTURE SURGERY Left   . ANKLE HARDWARE REMOVAL Left   . BIOPSY THYROID    . COLONOSCOPY    . LASIK    . WISDOM TOOTH EXTRACTION  1980    Family History  Problem Relation Age of Onset  . Diabetes Mother   . Hyperlipidemia Mother   . Hyperlipidemia Father   . Skin cancer Father        Basal cell; squamous cell  . Colon polyps Father   . Diabetes Sister   . Diabetes Maternal Grandmother   . Diabetes Maternal Grandfather   . Cancer Paternal Grandmother   . Cancer Paternal Grandfather   . Colon polyps Paternal Grandfather   . Breast cancer Neg Hx   . Rectal cancer Neg Hx   . Stomach cancer Neg Hx   . Colon cancer Neg Hx    Social History:  reports that she has never smoked. She has never used smokeless tobacco. She reports current alcohol use. She reports that she does not use drugs.  Allergies:  Allergies  Allergen Reactions  . Codeine Nausea And Vomiting and Other (See Comments)    Throws  it up    No medications prior to admission.    No results found for this or any previous visit (from the past 48 hour(s)). No results found.  Review of Systems  Constitutional: Negative for chills and fever.  Genitourinary: Negative for hematuria.  All other systems reviewed and are negative.   Last menstrual period 04/16/2016. Physical Exam  Constitutional: She appears well-developed.  HENT:  Head: Normocephalic.  Eyes: Pupils are equal, round, and reactive to light.  Neck: Normal range of motion.  Cardiovascular: Normal rate.  Respiratory: Effort normal.  GI:  Stable mild truncal obesity.   Genitourinary:    Genitourinary Comments: No CVAT   Musculoskeletal: Normal range of motion.  Neurological: She is alert.  Skin: Skin is warm.  Psychiatric: She has a normal mood and affect.     Assessment/Plan  Proceed as planned with RIGHT robotic partial nephrectomy. Risks, benefits, alternatives, expected peri-op course discussed previously and reiterated today.   Ann Frock, MD 06/23/2019, 4:47 AM

## 2019-06-23 NOTE — Discharge Instructions (Signed)

## 2019-06-23 NOTE — Anesthesia Procedure Notes (Signed)
Procedure Name: Intubation Date/Time: 06/23/2019 7:33 AM Performed by: Lollie Sails, CRNA Pre-anesthesia Checklist: Patient identified, Emergency Drugs available, Suction available, Patient being monitored and Timeout performed Patient Re-evaluated:Patient Re-evaluated prior to induction Oxygen Delivery Method: Circle system utilized Preoxygenation: Pre-oxygenation with 100% oxygen Induction Type: IV induction Ventilation: Mask ventilation without difficulty Laryngoscope Size: Miller and 3 Grade View: Grade I Tube type: Oral Tube size: 7.5 mm Number of attempts: 1 Airway Equipment and Method: Stylet Placement Confirmation: ETT inserted through vocal cords under direct vision,  positive ETCO2 and breath sounds checked- equal and bilateral Secured at: 22 cm Tube secured with: Tape Dental Injury: Teeth and Oropharynx as per pre-operative assessment

## 2019-06-24 ENCOUNTER — Encounter (HOSPITAL_COMMUNITY): Payer: Self-pay | Admitting: Urology

## 2019-06-24 DIAGNOSIS — C641 Malignant neoplasm of right kidney, except renal pelvis: Secondary | ICD-10-CM | POA: Diagnosis not present

## 2019-06-24 LAB — BASIC METABOLIC PANEL
Anion gap: 8 (ref 5–15)
BUN: 11 mg/dL (ref 6–20)
CO2: 24 mmol/L (ref 22–32)
Calcium: 8.8 mg/dL — ABNORMAL LOW (ref 8.9–10.3)
Chloride: 109 mmol/L (ref 98–111)
Creatinine, Ser: 0.84 mg/dL (ref 0.44–1.00)
GFR calc Af Amer: >60 mL/min (ref >60)
GFR calc non Af Amer: >60 mL/min (ref >60)
Glucose, Bld: 158 mg/dL — ABNORMAL HIGH (ref 70–99)
Potassium: 4.2 mmol/L (ref 3.5–5.1)
Sodium: 141 mmol/L (ref 135–145)

## 2019-06-24 LAB — TYPE AND SCREEN
ABO/RH(D): O POS
Antibody Screen: NEGATIVE

## 2019-06-24 LAB — CREATININE, FLUID (PLEURAL, PERITONEAL, JP DRAINAGE): Creat, Fluid: 0.9 mg/dL

## 2019-06-24 LAB — HEMOGLOBIN AND HEMATOCRIT, BLOOD
HCT: 38.2 % (ref 36.0–46.0)
Hemoglobin: 12.3 g/dL (ref 12.0–15.0)

## 2019-06-24 MED ORDER — BISACODYL 10 MG RE SUPP
10.0000 mg | Freq: Once | RECTAL | Status: AC
  Administered 2019-06-24: 10 mg via RECTAL
  Filled 2019-06-24: qty 1

## 2019-06-24 NOTE — Progress Notes (Signed)
Patient ID: Ann Miller, female   DOB: 1961-07-21, 58 y.o.   MRN: [id number]  1 Day Post-Op Subjective: Pt doing well.  Pain well controlled.  Minimal nausea.  No flatus.  Objective: Vital signs in last 24 hours: Temp:  [97.5 F (36.4 C)-99.9 F (37.7 C)] 99.1 F (37.3 C) (09/26 0620) Pulse Rate:  [60-82] 71 (09/26 0620) Resp:  [12-19] 18 (09/26 0620) BP: (112-143)/(52-80) 115/58 (09/26 0620) SpO2:  [95 %-100 %] 95 % (09/26 0620) Weight:  [85.9 kg] 85.9 kg (09/25 1300)  Intake/Output from previous day: 09/25 0701 - 09/26 0700 In: 4456.5 [I.V.:4286.5; IV Piggyback:100] Out: 920 [Urine:850; Drains:20; Blood:50] Intake/Output this shift: Total I/O In: -  Out: 1500 [Urine:1500]  Physical Exam:  General: Alert and oriented Abdomen: Soft, ND, NT Incisions: C/D/I Ext: NT, No erythema  Lab Results: Recent Labs    06/23/19 1037 06/24/19 0544  HGB 13.1 12.3  HCT 40.4 38.2   BMET Recent Labs    06/24/19 0544  NA 141  K 4.2  CL 109  CO2 24  GLUCOSE 158*  BUN 11  CREATININE 0.84  CALCIUM 8.8*     Studies/Results: Path pending  Assessment/Plan: POD # 1 s/p right RAL partial nephrectomy - Ambulate, IS, DVT prophylaxis - D/C catheter - Check drain Cr - Advance diet - Oral pain medication - SL IVF - Will reassess later today for possible discharge   LOS: 0 days   Dutch Gray 06/24/2019, 8:12 AM

## 2019-06-24 NOTE — Discharge Summary (Signed)
Date of admission: 06/23/2019  Date of discharge: 06/24/2019  Admission diagnosis: Right renal neoplasm  Discharge diagnosis: Right renal neoplasm  History and Physical: For full details, please see admission history and physical. Briefly, Ann Miller is a 58 y.o. year old patient with a right renal neoplasm suspicious for malignancy.   Hospital Course: She underwent a right robotic partial nephrectomy on 06/23/19.  The procedure was uncomplicated.  She remained hemodynamically stable overnight and was able to begin ambulating on POD # 1.  Her pain was controlled and she tolerated oral intake.  Her drain fluid was checked for creatinine and was consistent with serum and was removed.  She was discharged home on POD #1.  Laboratory values:  Recent Labs    06/23/19 1037 06/24/19 0544  HGB 13.1 12.3  HCT 40.4 38.2   Recent Labs    06/24/19 0544  CREATININE 0.84    Disposition: Home  Discharge instruction: The patient was instructed to be ambulatory but told to refrain from heavy lifting, strenuous activity, or driving.   Discharge medications:  Allergies as of 06/24/2019      Reactions   Codeine Nausea And Vomiting, Other (See Comments)   Throws it up      Medication List    TAKE these medications   atorvastatin 40 MG tablet Commonly known as: LIPITOR Take 1 tablet (40 mg total) by mouth daily. What changed: when to take this   levothyroxine 50 MCG tablet Commonly known as: SYNTHROID Take 1 tablet (50 mcg total) by mouth daily before breakfast.   Systane 0.4-0.3 % Soln Generic drug: Polyethyl Glycol-Propyl Glycol Apply 1-2 drops to eye 4 (four) times daily as needed (dry eyes).   traMADol 50 MG tablet Commonly known as: Ultram Take 1-2 tablets (50-100 mg total) by mouth every 6 (six) hours as needed for moderate pain or severe pain.       Followup:  Follow-up Information    Alexis Frock, MD On 07/10/2019.   Specialty: Urology Why: at 3 AM for MD  visit and pathology review Contact information: [address] [place] [address] [phone]

## 2019-06-26 NOTE — Op Note (Signed)
NAME: Ann Miller, [facility] MEDICAL RECORD [id number] ACCOUNT [id number] DATE OF BIRTH:[birth year] FACILITY: [facility] LOCATION: [facility] PHYSICIAN:Kimie Pidcock, MD  OPERATIVE REPORT  DATE OF PROCEDURE:  06/23/2019  PREOPERATIVE DIAGNOSIS:  Small right renal mass.  PROCEDURE:  Robotic-assisted laparoscopic right partial nephrectomy.  ESTIMATED BLOOD LOSS:  50 mL.  COMPLICATIONS:  None.  SPECIMEN: 1.  Right partial nephrectomy. 2.  Final margin, right partial nephrectomy for permanent pathology.  ASSISTANT:  Debbrah Alar, PA-C  FINDINGS: 1.  Very early branching artery, 1 vein, right renovascular anatomy. 2.  Predominantly exophytic right solid lateral renal mass.  INDICATIONS:  The patient is a pleasant 58 year old lady who was found to have a right-sided cortical renal mass concerning for renal cell carcinoma.  She underwent further staging with MRI, which also corroborated concern for small localized renal  cancer.  Options were discussed for management including surveillance protocols versus ablative therapy versus surgical extirpation, and she wished to proceed with partial nephrectomy with curative intent.  Informed consent was obtained and placed in the  medical record.  PROCEDURE IN DETAIL:  The patient being identified, the procedure being right robotic partial nephrectomy was confirmed.  Procedure time-out was performed.  IV antibiotics were administered.  General endotracheal anesthesia was induced.  The patient was  placed in the right-side-up, full-flank position with plane 15 degrees of table flexion, superior arm elevator, axillary roll, sequential compression devices, bottom leg bent, top leg straight.  She was further fastened to the table using 3-inch tape  with foam padding across the supraxiphoid chest and her pelvis.  A sterile field was created by prepping and draping the patient's entire right flank and abdomen using chlorhexidine gluconate.  Next, a high-flow,  low-pressure pneumoperitoneum was  obtained using Veress technique in the right lower quadrant, having passed the aspiration and drop test.  An 8 mm robotic camera port was then placed in position approximately 4 fingerbreadths superolateral to the umbilicus.  Laparoscopic examination of  the peritoneal cavity revealed no significant adhesions, no visceral injury.  Distal ports were placed as follows:  Right subcostal 8 mm robotic port, right far lateral 8 mm robotic port 3 fingerbreadths superolateral to the anterior superior iliac  spine, right paramedian inferior robotic port approximately 4 fingerbreadths superior to pubic ramus, two 12 mm assistant port sites to midline, 1 approximately 2 fingerbreadths above the planned camera port, one 2 fingerbreadths above the planned camera  port, and a 5 mm port in a subxiphoid location in the midline through which a self-locking grasper was used to raise this inferior border of the liver away from the anterior surface of Gerota's fascia.  Robot was then docked and passed the electronic  checks.  Initial attention was directed at development of the retroperitoneum.  Incision was made lateral to the ascending colon near the cecum towards the hepatic flexure.  It was carefully mobilized medially.  The duodenum was encountered and carefully  kocherized medially.  The lower pole of the kidney area was identified, placed on gentle lateral traction.  Dissection proceeded medial to this.  The ureter was encountered, also placed on gentle lateral traction, and dissection proceeded within the  triangle of the ureter, psoas musculature, and inferior vena cava towards the area of the renal hilum.  Right gonadal vein was kept medial.  The renal hilum consisted of a very early branching artery, single vein, and renovascular anatomy.  The main  trunk of the artery was then positioned such that it was well behind the  inferior vena cava and, as such, the dominant upper pole  branches were circumferentially mobilized and a Vesseloop was used to surround these and block rather than risk vascular  injury by dissecting blindly behind the vena cava.  Attention was directed to identification of the renal mass.  The anterior lateral aspect of the kidney was defatted and, as expected, there was a cortical mass that was solid at the lateral aspect, appearing  to be approximately 2 to 2.5 cm as expected.  The area of mass was scored for a partial nephrectomy.  Warm ischemia was then achieved with 2 bulldog clamps on the artery bundle, and partial nephrectomy was performed using cold scissors, keeping what  appeared to be a rim of normal parenchymal tissue with the plane of dissection.  Additional point coagulation current was applied to several small venous sinuses and arterioles.  Next, a final deep margin was sent separately for permanent pathology.   This appeared to be at the base of the resection site and again visually grossly normal.  First layer renorrhaphy was then performed using running 3-0 V-Loc suture, oversewing several small venous sinuses.  There was no obvious entry into the collecting  system.  Warm ischemia was then stopped by removing the table lug clamps for a total warm ischemia time of 14 minutes.  The second layer renorrhaphy was performed by placing a Surgicel bolster on the partial nephrectomy bed and placing 3 Vicryl  parenchymal apposition sutures that were sandwiched between Hem-o-loks and Lapra-Tys which resulted in excellent parenchymal apposition and complete hemostasis.  The Vesseloop was removed.  The partial nephrectomy specimen was placed in an EndoCatch bag  for later retrieval.  Gerota's was reapproximated using running 2-0 V-Loc to contain any hematoma, should they develop.  Again, sponge and needle counts were correct.  Hemostasis appeared excellent.  We achieved the goals of surgery today.  A closed  suction drain was up.  The previous lateral-most  robotic port site inferior peritoneal cavity.  The superior most assistant port site was closed with fascia using Carter-Thomason suture passer and 0 Vicryl.  Specimen was retrieved by extending the previous  inferior assistant port site for a distance of approximately 2 cm, removing the partial nephrectomy specimen, setting aside for permanent pathology.  This extraction site was then closed with fascia using figure-of-eight PDS x2, followed by  reapproximation of Scarpa's with a running Vicryl.  All incision sites were infiltrated with dilute lipolyzed Marcaine and closed at the level of skin using subcuticular Monocryl, Dermabond.  The procedure was then terminated.  The patient tolerated the  procedure well.  No immediate complications.  The patient was taken to postanesthesia care in stable condition.  LN/NUANCE  D:06/23/2019 T:06/23/2019 JOB:[id number]

## 2019-06-27 LAB — SURGICAL PATHOLOGY

## 2019-06-29 ENCOUNTER — Encounter: Payer: Self-pay | Admitting: Family Medicine

## 2019-08-09 ENCOUNTER — Telehealth: Payer: Self-pay | Admitting: Family Medicine

## 2019-08-09 NOTE — Telephone Encounter (Signed)
Pt called to schedule her 6 month f/u with Dr. Pamella Pert. She was told to come in a week early to do a lab visit. She said that she is needing a cholesterol check as well since Dr. Pamella Pert started her on a cholesterol medication. But I did not see a lipid panel in the active orders. Her appt for labs is 10/30/2019

## 2019-08-11 ENCOUNTER — Other Ambulatory Visit: Payer: Self-pay

## 2019-08-11 DIAGNOSIS — E7800 Pure hypercholesterolemia, unspecified: Secondary | ICD-10-CM

## 2019-08-11 NOTE — Telephone Encounter (Signed)
Order has been placed.

## 2019-10-18 ENCOUNTER — Ambulatory Visit: Payer: 59 | Admitting: Family Medicine

## 2019-10-30 ENCOUNTER — Other Ambulatory Visit: Payer: Self-pay

## 2019-10-30 ENCOUNTER — Ambulatory Visit: Payer: 59 | Admitting: Registered Nurse

## 2019-10-30 DIAGNOSIS — E7800 Pure hypercholesterolemia, unspecified: Secondary | ICD-10-CM

## 2019-10-31 LAB — LIPID PANEL
Chol/HDL Ratio: 3.2 ratio (ref 0.0–4.4)
Cholesterol, Total: 125 mg/dL (ref 100–199)
HDL: 39 mg/dL — ABNORMAL LOW (ref >39)
LDL Chol Calc (NIH): 72 mg/dL (ref 0–99)
Triglycerides: 70 mg/dL (ref 0–149)
VLDL Cholesterol Cal: 14 mg/dL (ref 5–40)

## 2019-10-31 NOTE — Progress Notes (Signed)
FYI - got routed to me  Kathrin Ruddy, NP

## 2019-11-06 ENCOUNTER — Ambulatory Visit (INDEPENDENT_AMBULATORY_CARE_PROVIDER_SITE_OTHER): Payer: 59 | Admitting: Family Medicine

## 2019-11-06 ENCOUNTER — Encounter: Payer: Self-pay | Admitting: Family Medicine

## 2019-11-06 ENCOUNTER — Other Ambulatory Visit: Payer: Self-pay

## 2019-11-06 VITALS — BP 124/83 | HR 77 | Temp 98.0°F | Ht 67.0 in | Wt 181.0 lb

## 2019-11-06 DIAGNOSIS — E7800 Pure hypercholesterolemia, unspecified: Secondary | ICD-10-CM

## 2019-11-06 DIAGNOSIS — E063 Autoimmune thyroiditis: Secondary | ICD-10-CM

## 2019-11-06 DIAGNOSIS — C641 Malignant neoplasm of right kidney, except renal pelvis: Secondary | ICD-10-CM | POA: Diagnosis not present

## 2019-11-06 DIAGNOSIS — E038 Other specified hypothyroidism: Secondary | ICD-10-CM

## 2019-11-06 DIAGNOSIS — Z131 Encounter for screening for diabetes mellitus: Secondary | ICD-10-CM

## 2019-11-06 DIAGNOSIS — E559 Vitamin D deficiency, unspecified: Secondary | ICD-10-CM

## 2019-11-06 NOTE — Progress Notes (Signed)
2/8/20218:48 AM  Ann Miller 1961/04/12, 59 y.o., female [id number]  Chief Complaint  Patient presents with  . Hyperlipidemia    HPI:   Patient is a 59 y.o. female with past medical history significant for hypothyroidism, colonic polyps, IBS-D, HLP, vitamin D deficiency and R papillary RCC s/p partial nephrectomy who presents today for followup up  Last OV July 2020 - no changes Since then she underwent robotic partial right nephrectomy with Dr Tresa Moore, urologist, with path showing papillary RCC, neg margins  Patient is overall doing well She is requesting screening for diabetes as her sister just got diagnosed with DM  Patient reports that no further treatment is needed, she will be seeing urologist in April with CXR and CT scan.  She does not take d supplement, gets it thru diet She has been working on weight loss, Noom She is taking all her meds as rx She has no acute concerns today   Lab Results  Component Value Date   CHOL 125 10/30/2019   HDL 39 (L) 10/30/2019   LDLCALC 72 10/30/2019   TRIG 70 10/30/2019   CHOLHDL 3.2 10/30/2019     Depression screen [facility] 2/9 11/06/2019 04/17/2019 01/18/2019  Decreased Interest 0 0 0  Down, Depressed, Hopeless 0 0 0  PHQ - 2 Score 0 0 0    Fall Risk  11/06/2019 04/17/2019 01/18/2019 04/28/2018 01/24/2018  Falls in the past year? 0 0 0 No No  Number falls in past yr: 0 0 - - -  Injury with Fall? 0 0 - - -     Allergies  Allergen Reactions  . Codeine Nausea And Vomiting and Other (See Comments)    Throws it up    Prior to Admission medications   Medication Sig Start Date End Date Taking? Authorizing Provider  atorvastatin (LIPITOR) 40 MG tablet Take 1 tablet (40 mg total) by mouth daily. Patient taking differently: Take 40 mg by mouth every evening.  01/18/19  Yes Rutherford Guys, MD  levothyroxine (SYNTHROID) 50 MCG tablet Take 1 tablet (50 mcg total) by mouth daily before breakfast. 03/10/19  Yes Rutherford Guys, MD    Polyethyl Glycol-Propyl Glycol (SYSTANE) 0.4-0.3 % SOLN Apply 1-2 drops to eye 4 (four) times daily as needed (dry eyes).    Yes [provider]    Past Medical History:  Diagnosis Date  . History of kidney stones 09/2018  . Hx of adenomatous colonic polyps 06/01/2013  . Hypercholesterolemia   . Hypothyroidism   . Irritable bowel syndrome (IBS)    diarrhea predominant  . Renal cell carcinoma of right kidney ([place]) 05/2019  . Thyroid nodule    follow up with biopsy no longer needs folllow up at this time  . Vitamin D deficiency    resolved    Past Surgical History:  Procedure Laterality Date  . ANKLE FRACTURE SURGERY Left   . ANKLE HARDWARE REMOVAL Left   . BIOPSY THYROID    . COLONOSCOPY    . LASIK    . ROBOTIC ASSITED PARTIAL NEPHRECTOMY Right 06/23/2019   Procedure: XI ROBOTIC ASSITED PARTIAL NEPHRECTOMY;  Surgeon: Alexis Frock, MD;  Location: [facility];  Service: Urology;  Laterality: Right;  3 HRS  . WISDOM TOOTH EXTRACTION  1980    Social History   Tobacco Use  . Smoking status: Never Smoker  . Smokeless tobacco: Never Used  Substance Use Topics  . Alcohol use: Yes    Comment: 2 drinks a month  Family History  Problem Relation Age of Onset  . Diabetes Mother   . Hyperlipidemia Mother   . Hyperlipidemia Father   . Skin cancer Father        Basal cell; squamous cell  . Colon polyps Father   . Diabetes Sister   . Diabetes Maternal Grandmother   . Diabetes Maternal Grandfather   . Cancer Paternal Grandmother   . Cancer Paternal Grandfather   . Colon polyps Paternal Grandfather   . Breast cancer Neg Hx   . Rectal cancer Neg Hx   . Stomach cancer Neg Hx   . Colon cancer Neg Hx     Review of Systems  Constitutional: Negative for chills and fever.  Respiratory: Negative for cough and shortness of breath.   Cardiovascular: Negative for chest pain, palpitations and leg swelling.  Gastrointestinal: Negative for abdominal pain, nausea and vomiting.      OBJECTIVE:  Today's Vitals   11/06/19 0844  BP: 124/83  Pulse: 77  Temp: 98 F (36.7 C)  SpO2: 97%  Weight: 181 lb (82.1 kg)  Height: 5\' 7"  (1.702 m)   Body mass index is 28.35 kg/m.   Wt Readings from Last 3 Encounters:  11/06/19 181 lb (82.1 kg)  06/23/19 189 lb 6 oz (85.9 kg)  06/15/19 183 lb 8 oz (83.2 kg)     Physical Exam Vitals and nursing note reviewed.  Constitutional:      Appearance: She is well-developed.  HENT:     Head: Normocephalic and atraumatic.     Mouth/Throat:     Pharynx: No oropharyngeal exudate.  Eyes:     General: No scleral icterus.    Conjunctiva/sclera: Conjunctivae normal.     Pupils: Pupils are equal, round, and reactive to light.  Cardiovascular:     Rate and Rhythm: Normal rate and regular rhythm.     Heart sounds: Normal heart sounds. No murmur. No friction rub. No gallop.   Pulmonary:     Effort: Pulmonary effort is normal.     Breath sounds: Normal breath sounds. No wheezing or rales.  Musculoskeletal:     Cervical back: Neck supple.  Skin:    General: Skin is warm and dry.  Neurological:     Mental Status: She is alert and oriented to person, place, and time.     No results found for this or any previous visit (from the past 24 hour(s)).  No results found.   ASSESSMENT and PLAN  1. Pure hypercholesterolemia Controlled. Continue current regime.  - Comprehensive metabolic panel - Lipid panel; Future - Comprehensive metabolic panel; Future  2. Hypothyroidism due to Hashimoto's thyroiditis Checking labs today, medications will be adjusted as needed.  - TSH - TSH; Future  3. Vitamin D deficiency Checking labs today, medications will be started as needed.  - VITAMIN D 25 Hydroxy (Vit-D Deficiency, Fractures); Future  4. Renal cell carcinoma of right kidney [facility] S/p partial nephrectomy. Managed by urology  5. Screening for diabetes mellitus - Hemoglobin A1c  Return in about 6 months (around  05/05/2020).with fasting labs 3-4 days prior    Rutherford Guys, MD Primary Care at [place], [place] [address] Ph.  [phone] Fax [phone]

## 2019-11-06 NOTE — Patient Instructions (Signed)
° ° ° °  If you have lab work done today you will be contacted with your lab results within the next 2 weeks.  If you have not heard from us then please contact us. The fastest way to get your results is to register for My Chart. ° ° °IF you received an x-ray today, you will receive an invoice from [facility]. Please contact [facility] at [phone] with questions or concerns regarding your invoice.  ° °IF you received labwork today, you will receive an invoice from [facility]. Please contact [facility] at [phone] with questions or concerns regarding your invoice.  ° °Our billing staff will not be able to assist you with questions regarding bills from these companies. ° °You will be contacted with the lab results as soon as they are available. The fastest way to get your results is to activate your My Chart account. Instructions are located on the last page of this paperwork. If you have not heard from us regarding the results in 2 weeks, please contact this office. °  ° ° ° °

## 2019-11-07 LAB — COMPREHENSIVE METABOLIC PANEL
ALT: 23 IU/L (ref 0–32)
AST: 20 IU/L (ref 0–40)
Albumin/Globulin Ratio: 1.8 (ref 1.2–2.2)
Albumin: 4.4 g/dL (ref 3.8–4.9)
Alkaline Phosphatase: 95 IU/L (ref 39–117)
BUN/Creatinine Ratio: 11 (ref 9–23)
BUN: 10 mg/dL (ref 6–24)
Bilirubin Total: 0.7 mg/dL (ref 0.0–1.2)
CO2: 23 mmol/L (ref 20–29)
Calcium: 9.6 mg/dL (ref 8.7–10.2)
Chloride: 106 mmol/L (ref 96–106)
Creatinine, Ser: 0.88 mg/dL (ref 0.57–1.00)
GFR calc Af Amer: 85 mL/min/{1.73_m2} (ref >59)
GFR calc non Af Amer: 74 mL/min/{1.73_m2} (ref >59)
Globulin, Total: 2.4 g/dL (ref 1.5–4.5)
Glucose: 111 mg/dL — ABNORMAL HIGH (ref 65–99)
Potassium: 4.2 mmol/L (ref 3.5–5.2)
Sodium: 146 mmol/L — ABNORMAL HIGH (ref 134–144)
Total Protein: 6.8 g/dL (ref 6.0–8.5)

## 2019-11-07 LAB — TSH: TSH: 4.48 u[IU]/mL (ref 0.450–4.500)

## 2019-11-07 LAB — HEMOGLOBIN A1C
Est. average glucose Bld gHb Est-mCnc: 114 mg/dL
Hgb A1c MFr Bld: 5.6 % (ref 4.8–5.6)

## 2019-12-13 ENCOUNTER — Other Ambulatory Visit: Payer: Self-pay | Admitting: Family Medicine

## 2019-12-13 NOTE — Telephone Encounter (Signed)
Requested Prescriptions  Pending Prescriptions Disp Refills  . atorvastatin (LIPITOR) 40 MG tablet [Pharmacy Med Name: ATORVASTATIN 40 MG TABLET] 90 tablet 3    Sig: TAKE 1 TABLET BY MOUTH EVERY DAY     Cardiovascular:  Antilipid - Statins Failed - 12/13/2019 11:21 AM      Failed - HDL in normal range and within 360 days    HDL  Date Value Ref Range Status  10/30/2019 39 (L) >39 mg/dL Final         Passed - Total Cholesterol in normal range and within 360 days    Cholesterol, Total  Date Value Ref Range Status  10/30/2019 125 100 - 199 mg/dL Final         Passed - LDL in normal range and within 360 days    LDL Chol Calc (NIH)  Date Value Ref Range Status  10/30/2019 72 0 - 99 mg/dL Final         Passed - Triglycerides in normal range and within 360 days    Triglycerides  Date Value Ref Range Status  10/30/2019 70 0 - 149 mg/dL Final         Passed - Patient is not pregnant      Passed - Valid encounter within last 12 months    Recent Outpatient Visits          1 month ago Pure hypercholesterolemia   [facility], Lilia Argue, MD   8 months ago Pure hypercholesterolemia   [facility], Lilia Argue, MD   8 months ago Vitamin D deficiency   [facility], Lilia Argue, MD   10 months ago Routine physical examination   [facility], Lilia Argue, MD   11 months ago Screening for hematuria or proteinuria   [facility], Ranell Patrick, MD      Future Appointments            In 4 months Rutherford Guys, MD Primary Care at [place], [facility]

## 2019-12-27 ENCOUNTER — Other Ambulatory Visit: Payer: Self-pay

## 2019-12-27 ENCOUNTER — Ambulatory Visit (HOSPITAL_COMMUNITY)
Admission: RE | Admit: 2019-12-27 | Discharge: 2019-12-27 | Disposition: A | Discharge status: Home / Self Care | Payer: Managed Care, Other (non HMO) | Source: Ambulatory Visit | Attending: Urology | Admitting: Urology

## 2019-12-27 ENCOUNTER — Other Ambulatory Visit (HOSPITAL_COMMUNITY): Payer: Self-pay | Admitting: Urology

## 2019-12-27 DIAGNOSIS — C641 Malignant neoplasm of right kidney, except renal pelvis: Secondary | ICD-10-CM | POA: Diagnosis not present

## 2020-03-06 ENCOUNTER — Other Ambulatory Visit: Payer: Self-pay | Admitting: Family Medicine

## 2020-03-06 DIAGNOSIS — E063 Autoimmune thyroiditis: Secondary | ICD-10-CM

## 2020-03-06 NOTE — Telephone Encounter (Signed)
Requested Prescriptions  Pending Prescriptions Disp Refills   levothyroxine (SYNTHROID) 50 MCG tablet [Pharmacy Med Name: LEVOTHYROXINE 50 MCG TABLET] 90 tablet 2    Sig: TAKE 1 TABLET BY MOUTH DAILY BEFORE BREAKFAST     Endocrinology:  Hypothyroid Agents Failed - 03/06/2020  1:16 AM      Failed - TSH needs to be rechecked within 3 months after an abnormal result. Refill until TSH is due.      Passed - TSH in normal range and within 360 days    TSH  Date Value Ref Range Status  11/06/2019 4.480 0.450 - 4.500 uIU/mL Final         Passed - Valid encounter within last 12 months    Recent Outpatient Visits          4 months ago Pure hypercholesterolemia   [facility], Lilia Argue, MD   10 months ago Pure hypercholesterolemia   [facility], Lilia Argue, MD   11 months ago Vitamin D deficiency   [facility], Lilia Argue, MD   1 year ago Routine physical examination   [facility], Lilia Argue, MD   1 year ago Screening for hematuria or proteinuria   [facility], Ranell Patrick, MD      Future Appointments            In 2 months Rutherford Guys, MD Primary Care at [place], [facility]

## 2020-04-26 ENCOUNTER — Other Ambulatory Visit: Payer: Self-pay | Admitting: Family Medicine

## 2020-04-26 DIAGNOSIS — Z1231 Encounter for screening mammogram for malignant neoplasm of breast: Secondary | ICD-10-CM

## 2020-04-29 ENCOUNTER — Ambulatory Visit: Payer: 59

## 2020-05-03 ENCOUNTER — Ambulatory Visit (INDEPENDENT_AMBULATORY_CARE_PROVIDER_SITE_OTHER): Payer: 59 | Admitting: Family Medicine

## 2020-05-03 ENCOUNTER — Other Ambulatory Visit: Payer: Self-pay

## 2020-05-03 DIAGNOSIS — E7800 Pure hypercholesterolemia, unspecified: Secondary | ICD-10-CM

## 2020-05-03 DIAGNOSIS — E063 Autoimmune thyroiditis: Secondary | ICD-10-CM

## 2020-05-03 DIAGNOSIS — E559 Vitamin D deficiency, unspecified: Secondary | ICD-10-CM

## 2020-05-03 DIAGNOSIS — E038 Other specified hypothyroidism: Secondary | ICD-10-CM

## 2020-05-04 LAB — VITAMIN D 25 HYDROXY (VIT D DEFICIENCY, FRACTURES): Vit D, 25-Hydroxy: 52.1 ng/mL (ref 30.0–100.0)

## 2020-05-04 LAB — COMPREHENSIVE METABOLIC PANEL
ALT: 26 IU/L (ref 0–32)
AST: 19 IU/L (ref 0–40)
Albumin/Globulin Ratio: 1.5 (ref 1.2–2.2)
Albumin: 4 g/dL (ref 3.8–4.9)
Alkaline Phosphatase: 90 IU/L (ref 48–121)
BUN/Creatinine Ratio: 14 (ref 9–23)
BUN: 13 mg/dL (ref 6–24)
Bilirubin Total: 0.8 mg/dL (ref 0.0–1.2)
CO2: 27 mmol/L (ref 20–29)
Calcium: 9.7 mg/dL (ref 8.7–10.2)
Chloride: 103 mmol/L (ref 96–106)
Creatinine, Ser: 0.92 mg/dL (ref 0.57–1.00)
GFR calc Af Amer: 80 mL/min/{1.73_m2} (ref >59)
GFR calc non Af Amer: 69 mL/min/{1.73_m2} (ref >59)
Globulin, Total: 2.6 g/dL (ref 1.5–4.5)
Glucose: 96 mg/dL (ref 65–99)
Potassium: 4.5 mmol/L (ref 3.5–5.2)
Sodium: 142 mmol/L (ref 134–144)
Total Protein: 6.6 g/dL (ref 6.0–8.5)

## 2020-05-04 LAB — TSH: TSH: 3.4 u[IU]/mL (ref 0.450–4.500)

## 2020-05-04 LAB — LIPID PANEL
Chol/HDL Ratio: 3.3 ratio (ref 0.0–4.4)
Cholesterol, Total: 137 mg/dL (ref 100–199)
HDL: 42 mg/dL (ref >39)
LDL Chol Calc (NIH): 79 mg/dL (ref 0–99)
Triglycerides: 82 mg/dL (ref 0–149)
VLDL Cholesterol Cal: 16 mg/dL (ref 5–40)

## 2020-05-06 ENCOUNTER — Other Ambulatory Visit: Payer: Self-pay

## 2020-05-06 ENCOUNTER — Ambulatory Visit
Admission: RE | Admit: 2020-05-06 | Discharge: 2020-05-06 | Disposition: A | Discharge status: Home / Self Care | Payer: Managed Care, Other (non HMO) | Source: Ambulatory Visit | Attending: Family Medicine | Admitting: Family Medicine

## 2020-05-06 ENCOUNTER — Ambulatory Visit: Payer: 59 | Admitting: Family Medicine

## 2020-05-06 DIAGNOSIS — Z1231 Encounter for screening mammogram for malignant neoplasm of breast: Secondary | ICD-10-CM

## 2020-05-07 ENCOUNTER — Ambulatory Visit (INDEPENDENT_AMBULATORY_CARE_PROVIDER_SITE_OTHER): Payer: 59 | Admitting: Family Medicine

## 2020-05-07 ENCOUNTER — Encounter: Payer: Self-pay | Admitting: Family Medicine

## 2020-05-07 ENCOUNTER — Other Ambulatory Visit: Payer: Self-pay

## 2020-05-07 VITALS — BP 125/85 | HR 89 | Temp 97.7°F | Ht 67.0 in | Wt 188.0 lb

## 2020-05-07 DIAGNOSIS — E063 Autoimmune thyroiditis: Secondary | ICD-10-CM

## 2020-05-07 DIAGNOSIS — E559 Vitamin D deficiency, unspecified: Secondary | ICD-10-CM

## 2020-05-07 DIAGNOSIS — E038 Other specified hypothyroidism: Secondary | ICD-10-CM | POA: Diagnosis not present

## 2020-05-07 DIAGNOSIS — E7800 Pure hypercholesterolemia, unspecified: Secondary | ICD-10-CM | POA: Diagnosis not present

## 2020-05-07 NOTE — Progress Notes (Signed)
8/10/20212:48 PM  Ann Miller [birth year], 59 y.o., female [id number]  Chief Complaint  Patient presents with  . Follow-up    lab drawn on 8/6, here to discuss    HPI:   Patient is a 59 y.o. female with past medical history significant for hypothyroidism, colonic polyps, IBS-D, HLP, vitamin D deficiency and R papillary RCC s/p partial nephrectomywho presents today for followup up  Last OV feb 2021  Labs done 8/6 - reviewed Normal Lipid, CMP and TSH, Vitamin D 52  She is overall doing well  She has no acute concerns today She is taking all meds as rx She is coping with caring for in laws that recently moved from [facility] urology yearly, every spring x 3 years  Depression screen [facility] 2/9 05/07/2020 11/06/2019 04/17/2019  Decreased Interest 0 0 0  Down, Depressed, Hopeless 0 0 0  PHQ - 2 Score 0 0 0    Fall Risk  05/07/2020 11/06/2019 04/17/2019 01/18/2019 04/28/2018  Falls in the past year? 0 0 0 0 No  Number falls in past yr: 0 0 0 - -  Injury with Fall? 0 0 0 - -     Allergies  Allergen Reactions  . Codeine Nausea And Vomiting and Other (See Comments)    Throws it up    Prior to Admission medications   Medication Sig Start Date End Date Taking? Authorizing Provider  atorvastatin (LIPITOR) 40 MG tablet TAKE 1 TABLET BY MOUTH EVERY DAY 12/13/19  Yes Rutherford Guys, MD  levothyroxine (SYNTHROID) 50 MCG tablet TAKE 1 TABLET BY MOUTH DAILY BEFORE BREAKFAST 03/06/20  Yes Rutherford Guys, MD  Polyethyl Glycol-Propyl Glycol (SYSTANE) 0.4-0.3 % SOLN Apply 1-2 drops to eye 4 (four) times daily as needed (dry eyes).    Yes [provider]    Past Medical History:  Diagnosis Date  . History of kidney stones 09/2018  . Hx of adenomatous colonic polyps 06/01/2013  . Hypercholesterolemia   . Hypothyroidism   . Irritable bowel syndrome (IBS)    diarrhea predominant  . Renal cell carcinoma of right kidney ([place]) 05/2019  . Thyroid nodule    follow up with biopsy no  longer needs folllow up at this time  . Vitamin D deficiency    resolved    Past Surgical History:  Procedure Laterality Date  . ANKLE FRACTURE SURGERY Left   . ANKLE HARDWARE REMOVAL Left   . BIOPSY THYROID    . COLONOSCOPY    . LASIK    . ROBOTIC ASSITED PARTIAL NEPHRECTOMY Right 06/23/2019   Procedure: XI ROBOTIC ASSITED PARTIAL NEPHRECTOMY;  Surgeon: Alexis Frock, MD;  Location: [facility];  Service: Urology;  Laterality: Right;  3 HRS  . WISDOM TOOTH EXTRACTION  1980    Social History   Tobacco Use  . Smoking status: Never Smoker  . Smokeless tobacco: Never Used  Substance Use Topics  . Alcohol use: Yes    Comment: 2 drinks a month    Family History  Problem Relation Age of Onset  . Diabetes Mother   . Hyperlipidemia Mother   . Hyperlipidemia Father   . Skin cancer Father        Basal cell; squamous cell  . Colon polyps Father   . Diabetes Sister   . Diabetes Maternal Grandmother   . Diabetes Maternal Grandfather   . Cancer Paternal Grandmother   . Cancer Paternal Grandfather   . Colon polyps Paternal Grandfather   . Breast  cancer Neg Hx   . Rectal cancer Neg Hx   . Stomach cancer Neg Hx   . Colon cancer Neg Hx     Review of Systems  Constitutional: Negative for chills and fever.  Respiratory: Negative for cough and shortness of breath.   Cardiovascular: Negative for chest pain, palpitations and leg swelling.  Gastrointestinal: Negative for abdominal pain, nausea and vomiting.     OBJECTIVE:  Today's Vitals   05/07/20 1445  BP: 125/85  Pulse: 89  Temp: 97.7 F (36.5 C)  SpO2: 100%  Weight: 188 lb (85.3 kg)  Height: 5\' 7"  (1.702 m)   Body mass index is 29.44 kg/m.  Wt Readings from Last 3 Encounters:  05/07/20 188 lb (85.3 kg)  11/06/19 181 lb (82.1 kg)  06/23/19 189 lb 6 oz (85.9 kg)    Physical Exam Vitals and nursing note reviewed.  Constitutional:      Appearance: She is well-developed.  HENT:     Head: Normocephalic and  atraumatic.     Mouth/Throat:     Pharynx: No oropharyngeal exudate.  Eyes:     General: No scleral icterus.    Extraocular Movements: Extraocular movements intact.     Conjunctiva/sclera: Conjunctivae normal.     Pupils: Pupils are equal, round, and reactive to light.  Cardiovascular:     Rate and Rhythm: Normal rate and regular rhythm.     Heart sounds: Normal heart sounds. No murmur heard.  No friction rub. No gallop.   Pulmonary:     Effort: Pulmonary effort is normal.     Breath sounds: Normal breath sounds. No wheezing, rhonchi or rales.  Musculoskeletal:     Cervical back: Neck supple.  Skin:    General: Skin is warm and dry.  Neurological:     Mental Status: She is alert and oriented to person, place, and time.     No results found for this or any previous visit (from the past 24 hour(s)).  MM 3D SCREEN BREAST BILATERAL  Result Date: 05/07/2020 CLINICAL DATA:  Screening. EXAM: DIGITAL SCREENING BILATERAL MAMMOGRAM WITH TOMO AND CAD COMPARISON:  Previous exam(s). ACR Breast Density Category b: There are scattered areas of fibroglandular density. FINDINGS: There are no findings suspicious for malignancy. Images were processed with CAD. IMPRESSION: No mammographic evidence of malignancy. A result letter of this screening mammogram will be mailed directly to the patient. RECOMMENDATION: Screening mammogram in one year. (Code:SM-B-01Y) BI-RADS CATEGORY  1: Negative. Electronically Signed   By: Ammie Ferrier M.D.   On: 05/07/2020 10:56     ASSESSMENT and PLAN  1. Pure hypercholesterolemia Controlled. Continue current regime.   2. Hypothyroidism due to Hashimoto's thyroiditis Controlled. Continue current regime.   3. Vitamin D deficiency At goal with diet intake.  Return in about 6 months (around 11/07/2020).    Rutherford Guys, MD Primary Care at [place],  [address] Ph.  [phone] Fax [phone]

## 2020-05-07 NOTE — Patient Instructions (Signed)
° ° ° °  If you have lab work done today you will be contacted with your lab results within the next 2 weeks.  If you have not heard from us then please contact us. The fastest way to get your results is to register for My Chart. ° ° °IF you received an x-ray today, you will receive an invoice from [facility]. Please contact [facility] at [phone] with questions or concerns regarding your invoice.  ° °IF you received labwork today, you will receive an invoice from [facility]. Please contact [facility] at [phone] with questions or concerns regarding your invoice.  ° °Our billing staff will not be able to assist you with questions regarding bills from these companies. ° °You will be contacted with the lab results as soon as they are available. The fastest way to get your results is to activate your My Chart account. Instructions are located on the last page of this paperwork. If you have not heard from us regarding the results in 2 weeks, please contact this office. °  ° ° ° °

## 2021-01-09 ENCOUNTER — Other Ambulatory Visit: Payer: Self-pay

## 2021-01-09 ENCOUNTER — Other Ambulatory Visit (HOSPITAL_COMMUNITY): Payer: Self-pay | Admitting: Urology

## 2021-01-09 ENCOUNTER — Ambulatory Visit (HOSPITAL_COMMUNITY)
Admission: RE | Admit: 2021-01-09 | Discharge: 2021-01-09 | Disposition: A | Discharge status: Home / Self Care | Payer: Managed Care, Other (non HMO) | Source: Ambulatory Visit | Attending: Urology | Admitting: Urology

## 2021-01-09 DIAGNOSIS — C641 Malignant neoplasm of right kidney, except renal pelvis: Secondary | ICD-10-CM | POA: Diagnosis not present

## 2021-01-14 ENCOUNTER — Other Ambulatory Visit: Payer: Self-pay | Admitting: Family Medicine

## 2021-01-14 DIAGNOSIS — Z1231 Encounter for screening mammogram for malignant neoplasm of breast: Secondary | ICD-10-CM

## 2021-03-24 ENCOUNTER — Other Ambulatory Visit: Payer: Self-pay | Admitting: Family Medicine

## 2021-03-24 DIAGNOSIS — Z1231 Encounter for screening mammogram for malignant neoplasm of breast: Secondary | ICD-10-CM

## 2021-05-15 ENCOUNTER — Ambulatory Visit
Admission: RE | Admit: 2021-05-15 | Discharge: 2021-05-15 | Disposition: A | Discharge status: Home / Self Care | Payer: Managed Care, Other (non HMO) | Source: Ambulatory Visit

## 2021-05-15 ENCOUNTER — Other Ambulatory Visit: Payer: Self-pay

## 2021-05-15 DIAGNOSIS — Z1231 Encounter for screening mammogram for malignant neoplasm of breast: Secondary | ICD-10-CM

## 2021-10-21 IMAGING — MG MM DIGITAL SCREENING BILAT W/ TOMO AND CAD
6 of 10 series · 6 of 30 positions shown · non-contrast
Comparison: Previous exam(s).

CLINICAL DATA: Screening.

EXAM:
DIGITAL SCREENING BILATERAL MAMMOGRAM WITH TOMOSYNTHESIS AND CAD
TECHNIQUE: Bilateral screening digital craniocaudal and mediolateral oblique
mammograms were obtained. Bilateral screening digital breast
tomosynthesis was performed. The images were evaluated with
computer-aided detection.

[R CC synth-2D (1 of 2)]
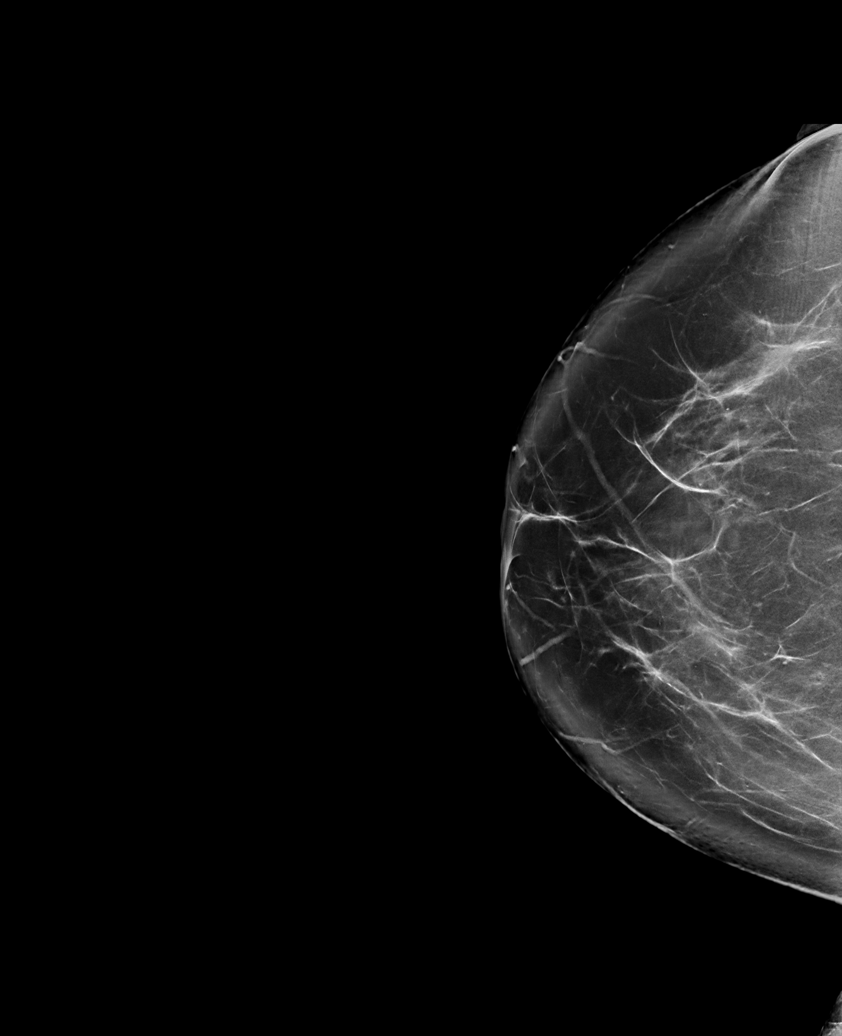

[L CC synth-2D]
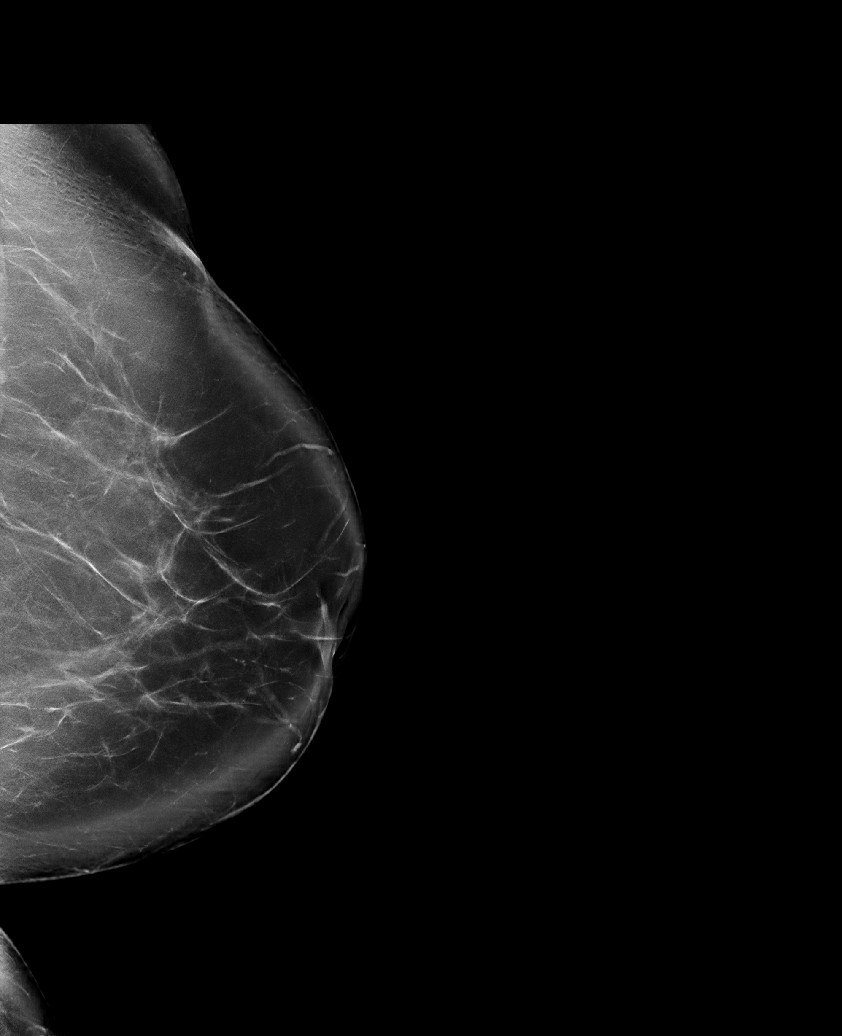

[R CC synth-2D (2 of 2)]
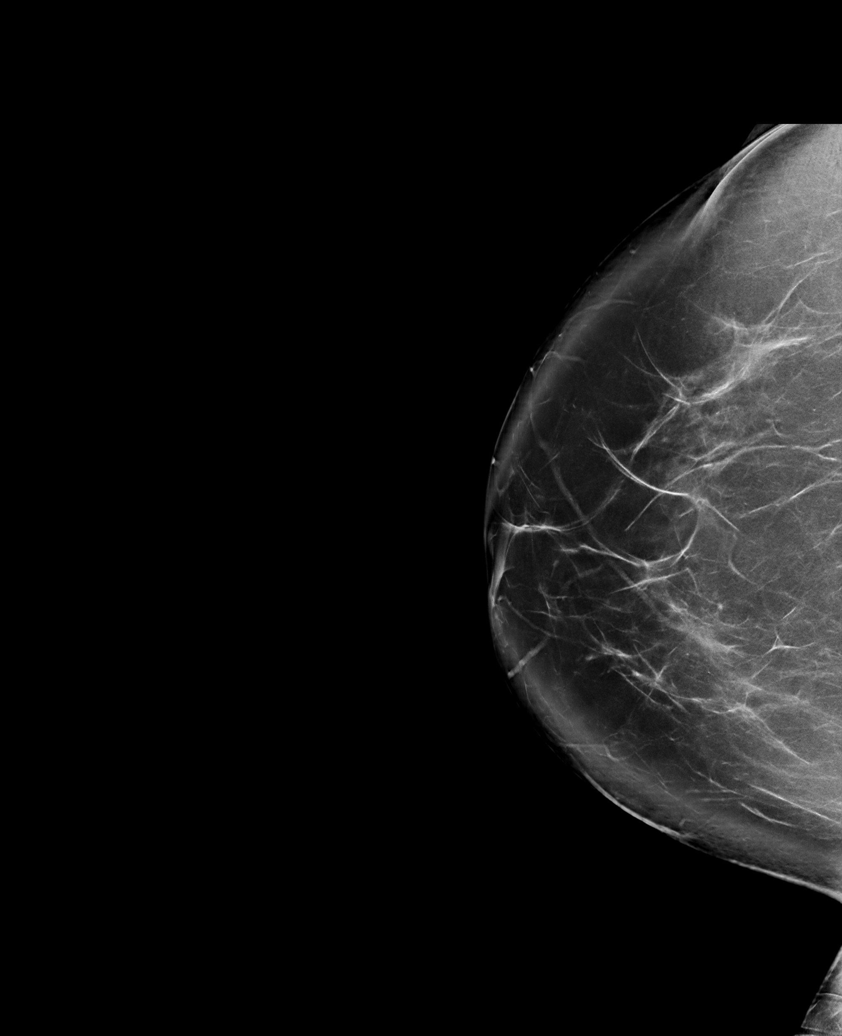

[R MLO synth-2D]
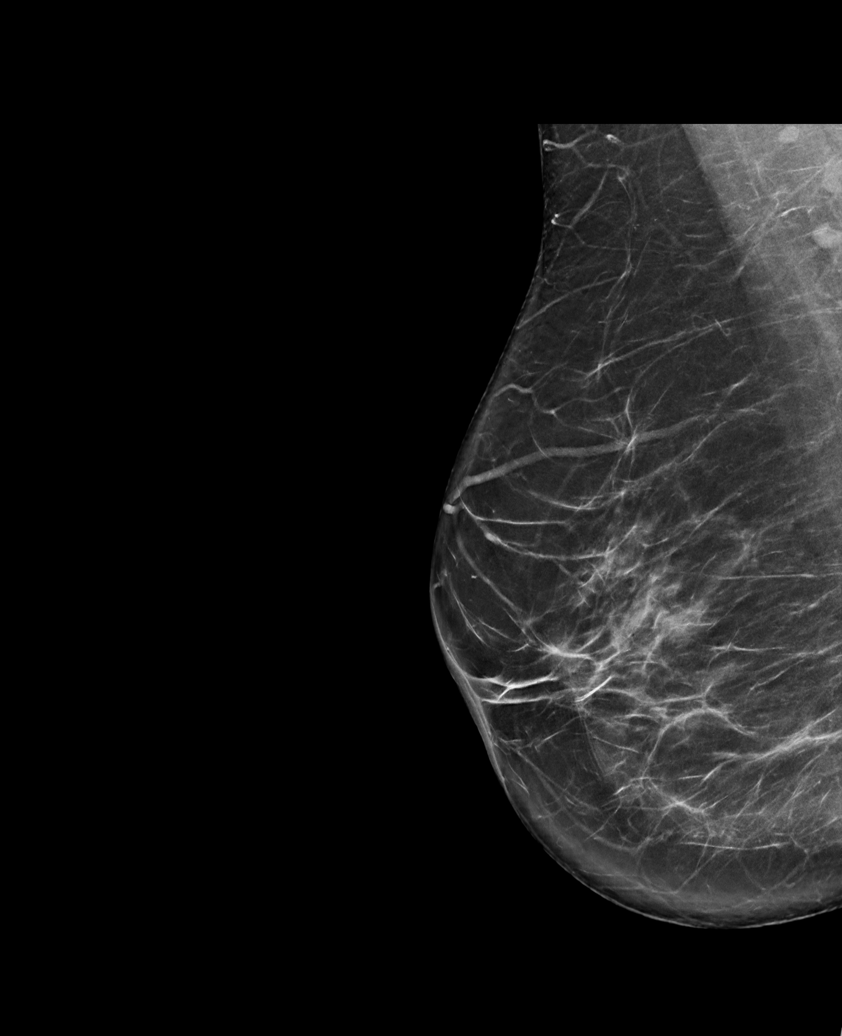

[L MLO synth-2D]
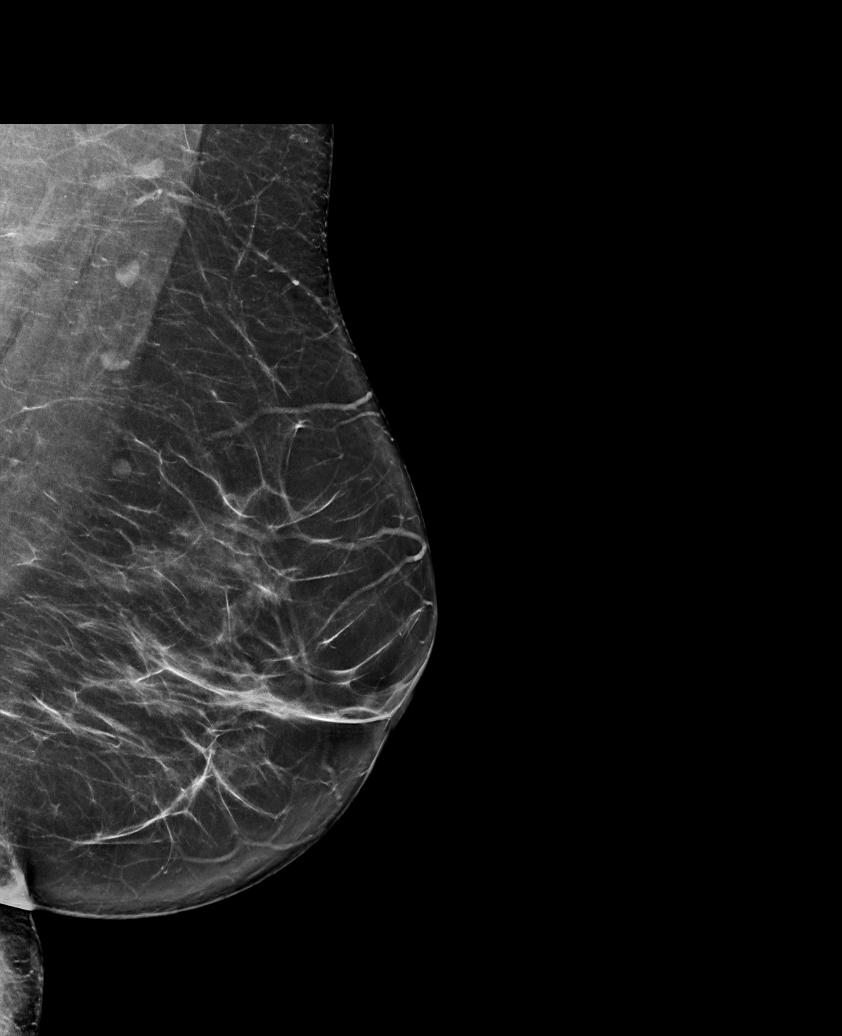

[R CC tomo · tomo slice 52/103.0]
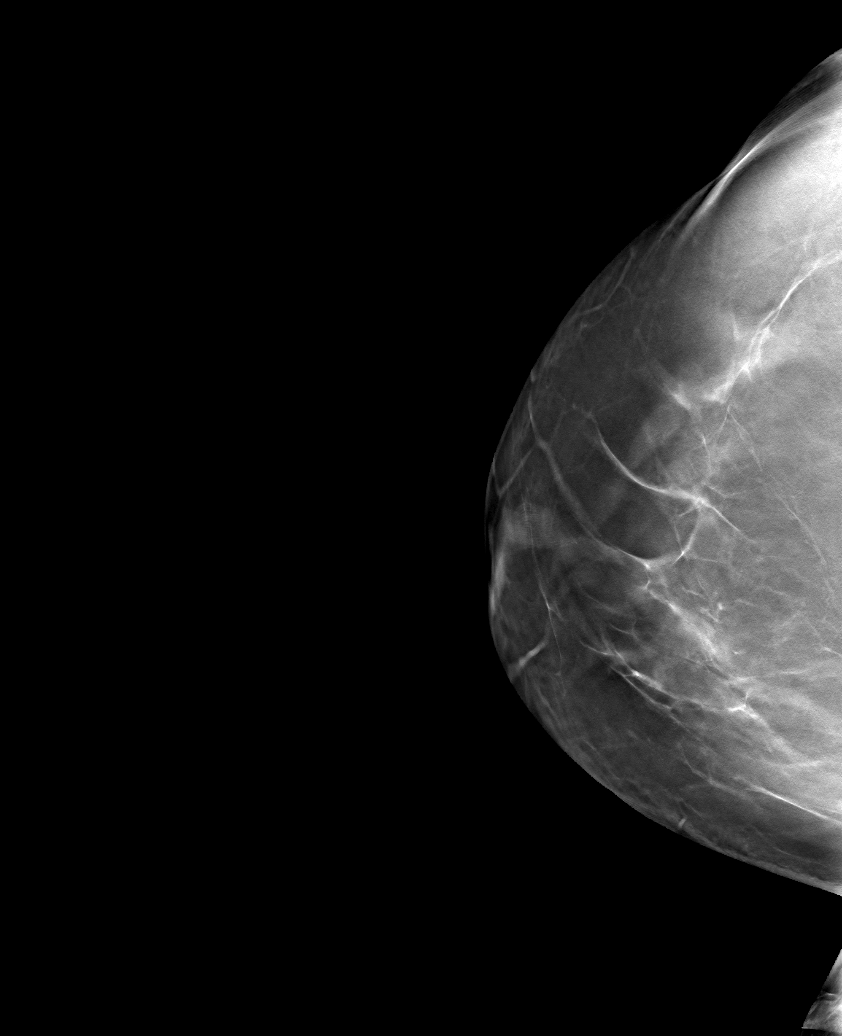

[6 of 30 positions shown; findings below may reference images not displayed]

ACR Breast Density Category b: There are scattered areas of
fibroglandular density.
FINDINGS: There are no findings suspicious for malignancy.
IMPRESSION: No mammographic evidence of malignancy. A result letter of this
screening mammogram will be mailed directly to the patient.

RECOMMENDATION:
Screening mammogram in one year. (Code:51-O-LD2)

BI-RADS CATEGORY  1: Negative.

## 2022-01-05 ENCOUNTER — Other Ambulatory Visit (HOSPITAL_COMMUNITY): Payer: Self-pay | Admitting: Urology

## 2022-01-05 ENCOUNTER — Ambulatory Visit (HOSPITAL_COMMUNITY)
Admission: RE | Admit: 2022-01-05 | Discharge: 2022-01-05 | Disposition: A | Discharge status: Home / Self Care | Payer: Managed Care, Other (non HMO) | Source: Ambulatory Visit | Attending: Urology | Admitting: Urology

## 2022-01-05 DIAGNOSIS — C641 Malignant neoplasm of right kidney, except renal pelvis: Secondary | ICD-10-CM | POA: Insufficient documentation

## 2022-04-09 ENCOUNTER — Other Ambulatory Visit: Payer: Self-pay | Admitting: Family Medicine

## 2022-04-09 DIAGNOSIS — Z1231 Encounter for screening mammogram for malignant neoplasm of breast: Secondary | ICD-10-CM

## 2022-05-18 ENCOUNTER — Ambulatory Visit
Admission: RE | Admit: 2022-05-18 | Discharge: 2022-05-18 | Disposition: A | Discharge status: Home / Self Care | Payer: Managed Care, Other (non HMO) | Source: Ambulatory Visit | Attending: Family Medicine | Admitting: Family Medicine

## 2022-05-18 DIAGNOSIS — Z1231 Encounter for screening mammogram for malignant neoplasm of breast: Secondary | ICD-10-CM

## 2022-06-13 IMAGING — CR DG CHEST 2V
2 series · 2 of 2 positions shown · non-contrast
Comparison: 01/09/2021

CLINICAL DATA: History of kidney cancer

EXAM:
CHEST - 2 VIEW

[w chest pa]
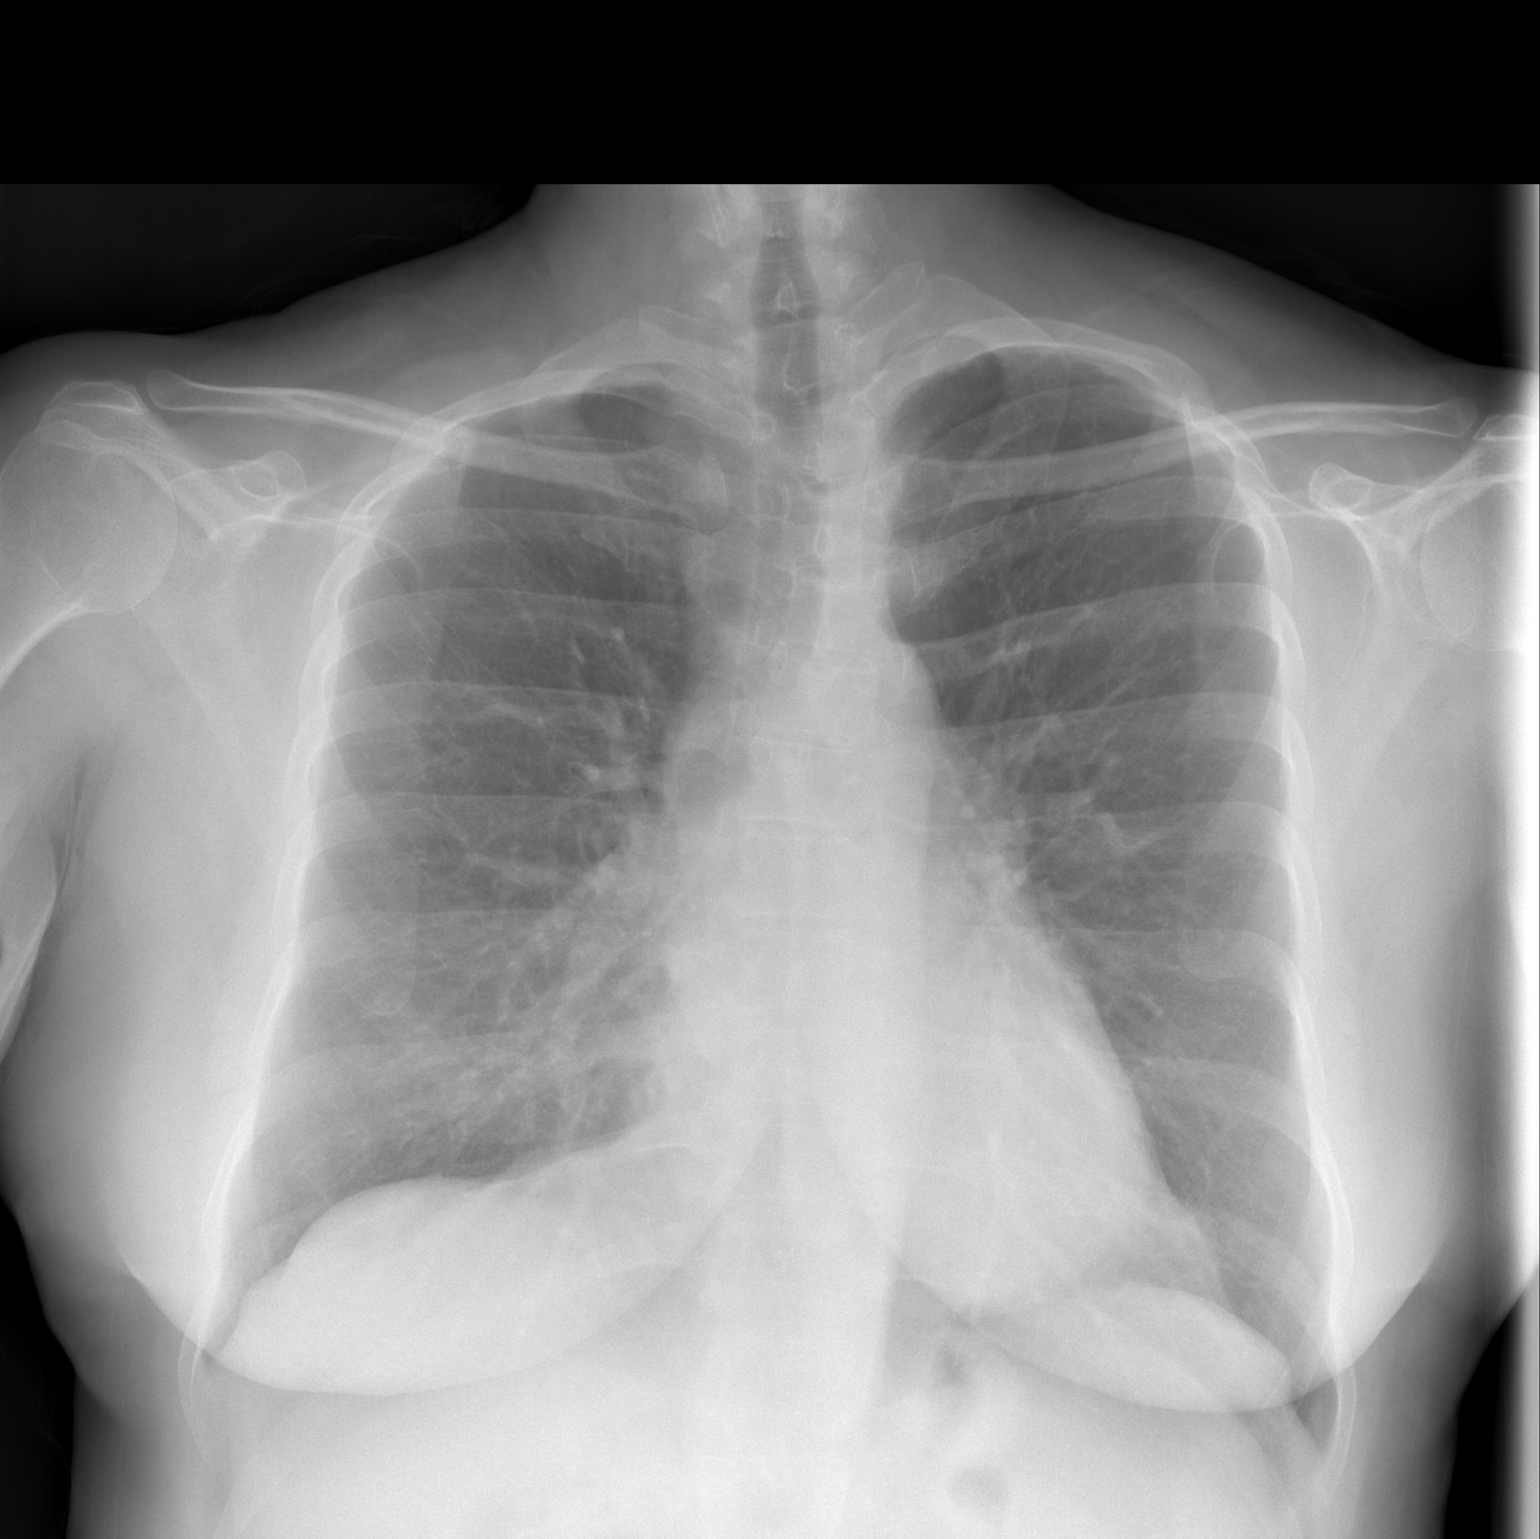

[w chest lat]
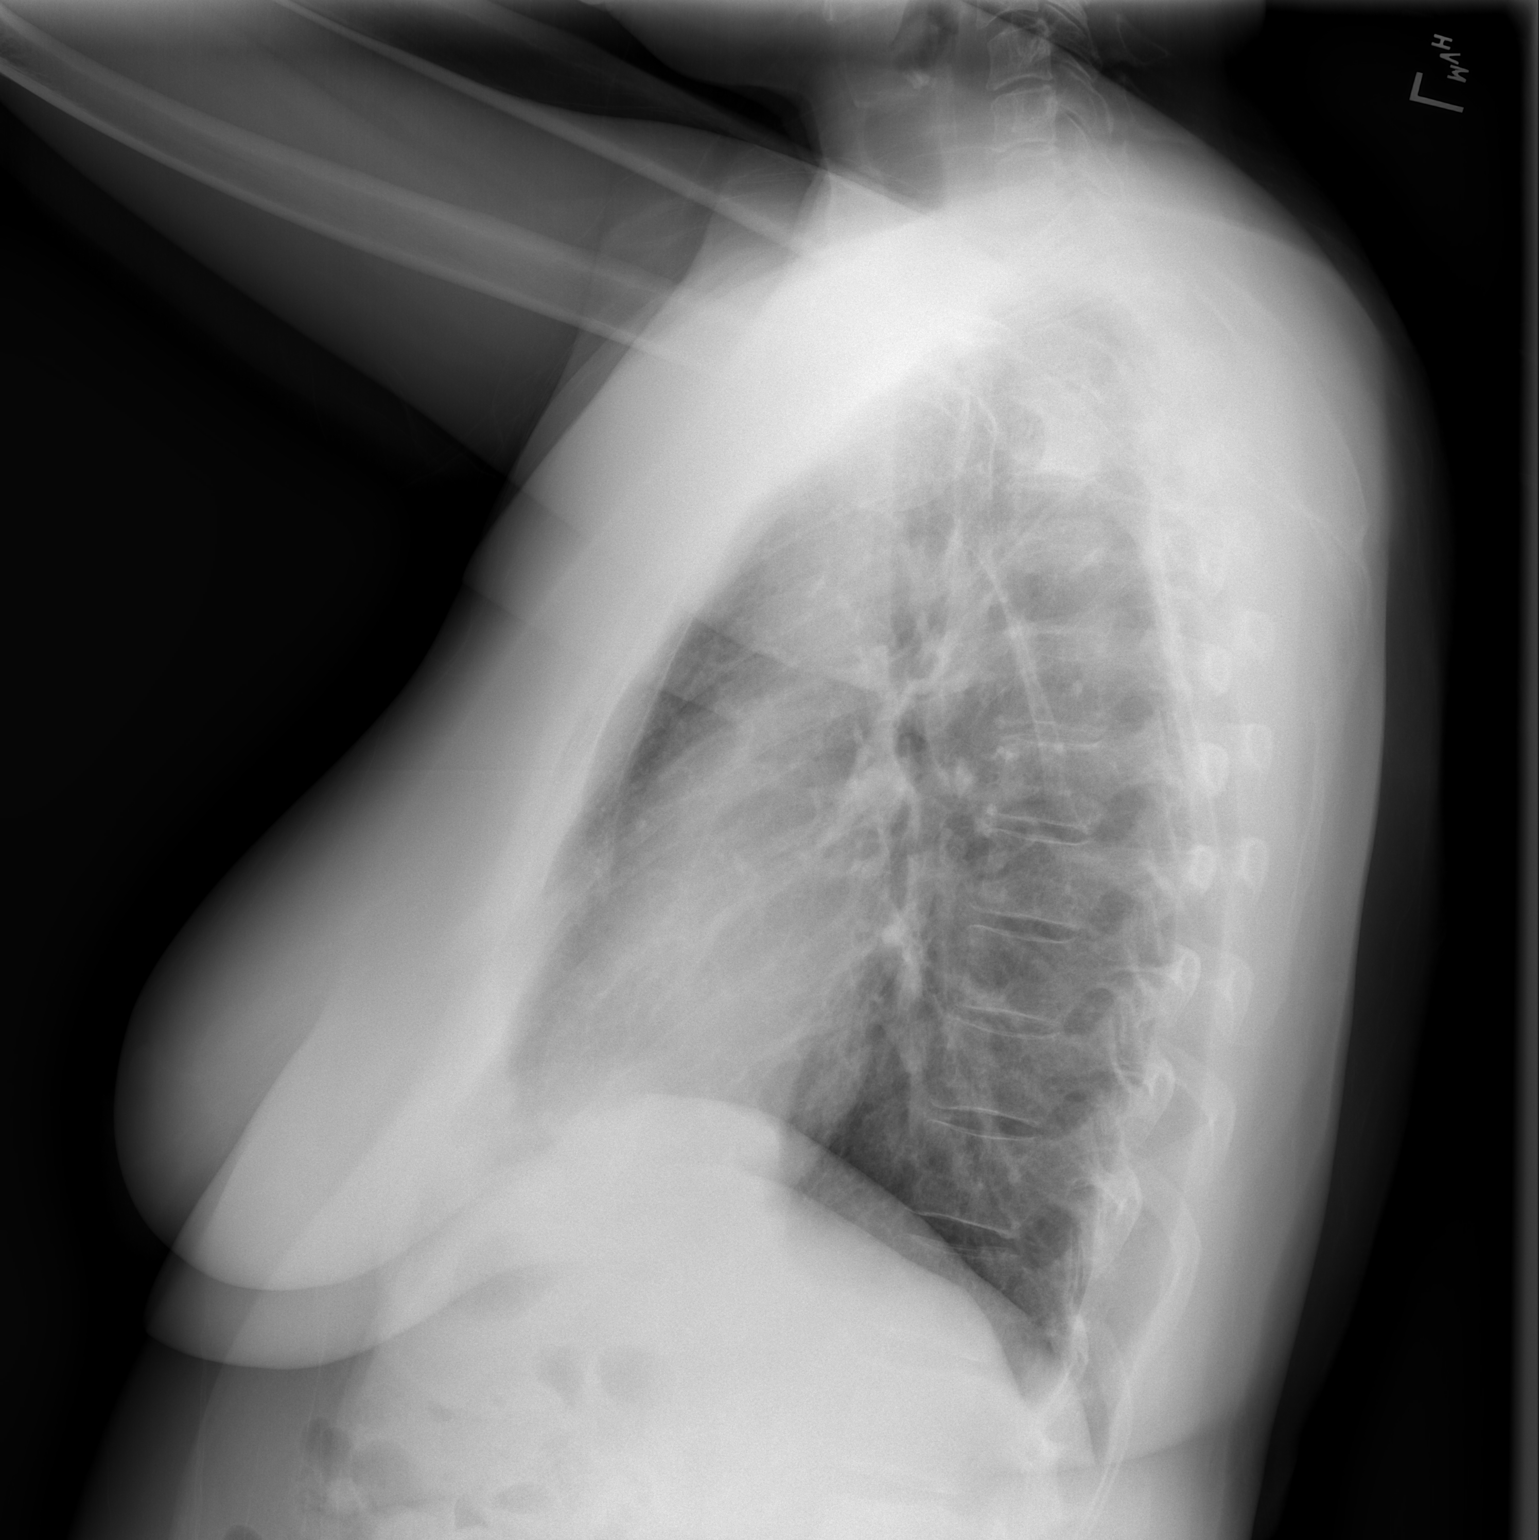

[2 of 2 positions shown; findings below may reference images not displayed]

FINDINGS: The heart size and mediastinal contours are within normal limits.
Both lungs are clear. The visualized skeletal structures are
unremarkable.
IMPRESSION: No active cardiopulmonary disease.

## 2023-01-08 ENCOUNTER — Other Ambulatory Visit: Payer: Self-pay | Admitting: Family Medicine

## 2023-01-08 DIAGNOSIS — Z1231 Encounter for screening mammogram for malignant neoplasm of breast: Secondary | ICD-10-CM

## 2023-01-08 DIAGNOSIS — E559 Vitamin D deficiency, unspecified: Secondary | ICD-10-CM

## 2023-02-04 ENCOUNTER — Ambulatory Visit (HOSPITAL_COMMUNITY)
Admission: RE | Admit: 2023-02-04 | Discharge: 2023-02-04 | Disposition: A | Discharge status: Home / Self Care | Payer: Managed Care, Other (non HMO) | Source: Ambulatory Visit | Attending: Urology | Admitting: Urology

## 2023-02-04 ENCOUNTER — Other Ambulatory Visit (HOSPITAL_COMMUNITY): Payer: Self-pay | Admitting: Urology

## 2023-02-04 DIAGNOSIS — C641 Malignant neoplasm of right kidney, except renal pelvis: Secondary | ICD-10-CM | POA: Diagnosis present

## 2023-05-03 ENCOUNTER — Ambulatory Visit
Admission: RE | Admit: 2023-05-03 | Discharge: 2023-05-03 | Disposition: A | Discharge status: Home / Self Care | Payer: Managed Care, Other (non HMO) | Source: Ambulatory Visit | Attending: Family Medicine | Admitting: Family Medicine

## 2023-05-03 DIAGNOSIS — Z1231 Encounter for screening mammogram for malignant neoplasm of breast: Secondary | ICD-10-CM

## 2023-07-13 ENCOUNTER — Encounter: Payer: Self-pay | Admitting: Internal Medicine

## 2023-07-26 ENCOUNTER — Ambulatory Visit
Admission: RE | Admit: 2023-07-26 | Discharge: 2023-07-26 | Disposition: A | Discharge status: Home / Self Care | Payer: Managed Care, Other (non HMO) | Source: Ambulatory Visit | Attending: Family Medicine | Admitting: Family Medicine

## 2023-07-26 DIAGNOSIS — E559 Vitamin D deficiency, unspecified: Secondary | ICD-10-CM

## 2023-08-10 ENCOUNTER — Ambulatory Visit (AMBULATORY_SURGERY_CENTER): Payer: BC Managed Care – PPO

## 2023-08-10 ENCOUNTER — Encounter: Payer: Self-pay | Admitting: Internal Medicine

## 2023-08-10 VITALS — Ht 67.0 in | Wt 198.0 lb

## 2023-08-10 DIAGNOSIS — Z860101 Personal history of adenomatous and serrated colon polyps: Secondary | ICD-10-CM

## 2023-08-10 NOTE — Progress Notes (Signed)
No egg or soy allergy known to patient  No issues known to pt with past sedation with any surgeries or procedures Patient denies ever being told they had issues or difficulty with intubation  No FH of Malignant Hyperthermia Pt is not on diet pills Pt is not on  home 02  Pt is not on blood thinners  Pt denies issues with constipation  No A fib or A flutter Have any cardiac testing pending--on  LOA: independent  Prep: spilt dose miralax   Patient's chart reviewed by Cathlyn Parsons CNRA prior to previsit and patient appropriate for the [facility].  Previsit completed and red dot placed by patient's name on their procedure day (on provider's schedule).     PV competed with patient. Prep instructions sent via mychart and home address.

## 2023-08-23 NOTE — Progress Notes (Unsigned)
[facility] History and Physical   Primary Care Physician:  Lewis Moccasin, MD   Reason for Procedure:   Hx colon polyps  Plan:    colonoscopy     HPI: Ann Miller is a 62 y.o. female w/ past polyp hx as below: 2014subcm ssp 2009 sub cm ssp and adenoma  Past Medical History:  Diagnosis Date   History of kidney stones 09/2018   Hx of adenomatous colonic polyps 06/01/2013   Hypercholesterolemia    Hypothyroidism    Irritable bowel syndrome (IBS)    diarrhea predominant   Renal cell carcinoma of right kidney (HCC) 05/2019   Thyroid nodule    follow up with biopsy no longer needs folllow up at this time   Vitamin D deficiency    resolved    Past Surgical History:  Procedure Laterality Date   ANKLE FRACTURE SURGERY Left    ANKLE HARDWARE REMOVAL Left    BIOPSY THYROID     COLONOSCOPY     LASIK     ROBOTIC ASSITED PARTIAL NEPHRECTOMY Right 06/23/2019   Procedure: XI ROBOTIC ASSITED PARTIAL NEPHRECTOMY;  Surgeon: Sebastian Ache, MD;  Location: [facility];  Service: Urology;  Laterality: Right;  3 HRS   WISDOM TOOTH EXTRACTION  1980    Prior to Admission medications   Medication Sig Start Date End Date Taking? Authorizing Provider  levothyroxine (SYNTHROID) 75 MCG tablet Take 75 mcg by mouth every other day.    [provider]  levothyroxine (SYNTHROID) 88 MCG tablet Take 88 mcg by mouth every other day. 06/07/22   [provider]  Polyethyl Glycol-Propyl Glycol (SYSTANE) 0.4-0.3 % SOLN Apply 1-2 drops to eye 4 (four) times daily as needed (dry eyes).     [provider]  rosuvastatin (CRESTOR) 5 MG tablet Take 5 mg by mouth daily. 06/26/22   [provider]    Current Outpatient Medications  Medication Sig Dispense Refill   levothyroxine (SYNTHROID) 75 MCG tablet Take 75 mcg by mouth every other day.     levothyroxine (SYNTHROID) 88 MCG tablet Take 88 mcg by mouth every other day.     Polyethyl Glycol-Propyl  Glycol (SYSTANE) 0.4-0.3 % SOLN Apply 1-2 drops to eye 4 (four) times daily as needed (dry eyes).      rosuvastatin (CRESTOR) 5 MG tablet Take 5 mg by mouth daily.     No current facility-administered medications for this visit.    Allergies as of 08/24/2023 - Review Complete 08/10/2023  Allergen Reaction Noted   Codeine Nausea And Vomiting and Other (See Comments) 03/05/2017    Family History  Problem Relation Age of Onset   Diabetes Mother    Hyperlipidemia Mother    Hyperlipidemia Father    Skin cancer Father        Basal cell; squamous cell   Colon polyps Father    Diabetes Sister    Diabetes Maternal Grandmother    Diabetes Maternal Grandfather    Cancer Paternal Grandmother    Cancer Paternal Grandfather    Colon polyps Paternal Grandfather    Breast cancer Neg Hx    Rectal cancer Neg Hx    Stomach cancer Neg Hx    Colon cancer Neg Hx    Esophageal cancer Neg Hx     Social History   Socioeconomic History   Marital status: Married    Spouse name: Not on file   Number of children: Not on file   Years of education: Not on file  Highest education level: Not on file  Occupational History   Not on file  Tobacco Use   Smoking status: Never   Smokeless tobacco: Never  Vaping Use   Vaping status: Never Used  Substance and Sexual Activity   Alcohol use: Yes    Comment: 2 drinks a month   Drug use: No   Sexual activity: Yes    Birth control/protection: Post-menopausal  Other Topics Concern   Not on file  Social History Narrative   Marital status: married x 33 years; happily; no abuse; Moved from [place] in 2016      Children: (26, 24, 19); no grandchildren     Lives: with husband, youngest child.        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   Water aerobics.    Social Determinants of Health   Financial Resource Strain: Not on file  Food Insecurity: Not on file  Transportation Needs: Not on file  Physical Activity: Not on file  Stress: Not on file  Social Connections: Not on file  Intimate Partner Violence: Not on file    Review of Systems: Positive for *** All other review of systems negative except as mentioned in the HPI.  Physical Exam: Vital signs LMP 04/16/2016 Comment: postmenopausal  General:   Alert,  Well-developed, well-nourished, pleasant and cooperative in NAD Lungs:  Clear throughout to auscultation.   Heart:  Regular rate and rhythm; no murmurs, clicks, rubs,  or gallops. Abdomen:  Soft, nontender and nondistended. Normal bowel sounds.   Neuro/Psych:  Alert and cooperative. Normal mood and affect. A and O x 3   @Aidyn Kellis  Sena Slate, MD, [facility] [phone] (pager) 08/23/2023 6:54 PM@

## 2023-08-24 ENCOUNTER — Encounter: Payer: Self-pay | Admitting: Internal Medicine

## 2023-08-24 ENCOUNTER — Ambulatory Visit: Payer: BC Managed Care – PPO | Admitting: Internal Medicine

## 2023-08-24 VITALS — BP 112/60 | HR 56 | Temp 97.8°F | Resp 12 | Ht 67.0 in | Wt 198.0 lb

## 2023-08-24 DIAGNOSIS — Z860101 Personal history of adenomatous and serrated colon polyps: Secondary | ICD-10-CM | POA: Diagnosis not present

## 2023-08-24 DIAGNOSIS — K5730 Diverticulosis of large intestine without perforation or abscess without bleeding: Secondary | ICD-10-CM

## 2023-08-24 DIAGNOSIS — Z1211 Encounter for screening for malignant neoplasm of colon: Secondary | ICD-10-CM

## 2023-08-24 DIAGNOSIS — K635 Polyp of colon: Secondary | ICD-10-CM

## 2023-08-24 DIAGNOSIS — D125 Benign neoplasm of sigmoid colon: Secondary | ICD-10-CM

## 2023-08-24 MED ORDER — SODIUM CHLORIDE 0.9 % IV SOLN: 500.0000 mL | INTRAVENOUS | Status: DC

## 2023-08-24 NOTE — Op Note (Signed)
[facility] Patient Name: Ann Miller Procedure Date: 08/24/2023 8:32 AM MRN: [id number] Endoscopist: Iva Boop , MD, [id number] Age: 62 Referring MD:  Date of Birth: May 15, 1961 Gender: Female Account #: [id number] Procedure:                Colonoscopy Indications:              Surveillance: Personal history of adenomatous                            polyps on last colonoscopy 5 years ago Medicines:                Monitored Anesthesia Care Procedure:                Pre-Anesthesia Assessment:                           - Prior to the procedure, a History and Physical                            was performed, and patient medications and                            allergies were reviewed. The patient's tolerance of                            previous anesthesia was also reviewed. The risks                            and benefits of the procedure and the sedation                            options and risks were discussed with the patient.                            All questions were answered, and informed consent                            was obtained. Prior Anticoagulants: The patient has                            taken no anticoagulant or antiplatelet agents. ASA                            Grade Assessment: II - A patient with mild systemic                            disease. After reviewing the risks and benefits,                            the patient was deemed in satisfactory condition to                            undergo the procedure.  After obtaining informed consent, the colonoscope                            was passed under direct vision. Throughout the                            procedure, the patient's blood pressure, pulse, and                            oxygen saturations were monitored continuously. The                            Olympus Scope [id number] was introduced through the                            anus and advanced to  the the cecum, identified by                            appendiceal orifice and ileocecal valve. The                            colonoscopy was performed without difficulty. The                            patient tolerated the procedure well. The quality                            of the bowel preparation was good. The ileocecal                            valve, appendiceal orifice, and rectum were                            photographed. The bowel preparation used was                            Miralax via split dose instruction. Scope In: 8:42:34 AM Scope Out: 8:56:57 AM Scope Withdrawal Time: 0 hours 10 minutes 48 seconds  Total Procedure Duration: 0 hours 14 minutes 23 seconds  Findings:                 The perianal and digital rectal examinations were                            normal.                           A diminutive polyp was found in the sigmoid colon.                            The polyp was sessile. The polyp was removed with a                            cold snare. Resection and retrieval were complete.  Verification of patient identification for the                            specimen was done. Estimated blood loss was minimal.                           Multiple diverticula were found in the entire colon.                           The exam was otherwise without abnormality on                            direct and retroflexion views. Complications:            No immediate complications. Estimated Blood Loss:     Estimated blood loss was minimal. Impression:               - One diminutive polyp in the sigmoid colon,                            removed with a cold snare. Resected and retrieved.                           - Diverticulosis in the entire examined colon.                           - The examination was otherwise normal on direct                            and retroflexion views.                           - Personal history of colonic  polyps. 2014subcm ssp                           2009 sub cm ssp and adenoma Recommendation:           - Patient has a contact number available for                            emergencies. The signs and symptoms of potential                            delayed complications were discussed with the                            patient. Return to normal activities tomorrow.                            Written discharge instructions were provided to the                            patient.                           - Resume previous diet.                           -  Continue present medications.                           - Repeat colonoscopy is recommended for                            surveillance. The colonoscopy date will be                            determined after pathology results from today's                            exam become available for review. Iva Boop, MD 08/24/2023 9:07:31 AM This report has been signed electronically.

## 2023-08-24 NOTE — Progress Notes (Signed)
Called to room to assist during endoscopic procedure.  Patient ID and intended procedure confirmed with present staff. Received instructions for my participation in the procedure from the performing physician.  

## 2023-08-24 NOTE — Progress Notes (Signed)
PT taken to PACU. Monitors in place. VSS. Report given to RN. 

## 2023-08-24 NOTE — Patient Instructions (Addendum)
There was one tiny polyp seen and removed.  You have diverticulosis - thickened muscle rings and pouches in the colon wall. Please read the handout about this condition.  I will let you know pathology results and when to have another routine colonoscopy by mail and/or My Chart.  I appreciate the opportunity to care for you. Iva Boop, MD, FACG   YOU HAD AN ENDOSCOPIC PROCEDURE TODAY AT THE [facility]:   Refer to the procedure report that was given to you for any specific questions about what was found during the examination.  If the procedure report does not answer your questions, please call your gastroenterologist to clarify.  If you requested that your care partner not be given the details of your procedure findings, then the procedure report has been included in a sealed envelope for you to review at your convenience later.  YOU SHOULD EXPECT: Some feelings of bloating in the abdomen. Passage of more gas than usual.  Walking can help get rid of the air that was put into your GI tract during the procedure and reduce the bloating. If you had a lower endoscopy (such as a colonoscopy or flexible sigmoidoscopy) you may notice spotting of blood in your stool or on the toilet paper. If you underwent a bowel prep for your procedure, you may not have a normal bowel movement for a few days.  Please Note:  You might notice some irritation and congestion in your nose or some drainage.  This is from the oxygen used during your procedure.  There is no need for concern and it should clear up in a day or so.  SYMPTOMS TO REPORT IMMEDIATELY:  Following lower endoscopy (colonoscopy or flexible sigmoidoscopy):  Excessive amounts of blood in the stool  Significant tenderness or worsening of abdominal pains  Swelling of the abdomen that is new, acute  Fever of 100F or higher   For urgent or emergent issues, a gastroenterologist can be reached at any hour by calling [phone]. Do  not use MyChart messaging for urgent concerns.    DIET:  We do recommend a small meal at first, but then you may proceed to your regular diet.  Drink plenty of fluids but you should avoid alcoholic beverages for 24 hours.  ACTIVITY:  You should plan to take it easy for the rest of today and you should NOT DRIVE or use heavy machinery until tomorrow (because of the sedation medicines used during the test).    FOLLOW UP: Our staff will call the number listed on your records the next business day following your procedure.  We will call around 7:15- 8:00 am to check on you and address any questions or concerns that you may have regarding the information given to you following your procedure. If we do not reach you, we will leave a message.     If any biopsies were taken you will be contacted by phone or by letter within the next 1-3 weeks.  Please call us at [phone] if you have not heard about the biopsies in 3 weeks.    SIGNATURES/CONFIDENTIALITY: You and/or your care partner have signed paperwork which will be entered into your electronic medical record.  These signatures attest to the fact that that the information above on your After Visit Summary has been reviewed and is understood.  Full responsibility of the confidentiality of this discharge information lies with you and/or your care-partner.

## 2023-08-25 ENCOUNTER — Telehealth: Payer: Self-pay | Admitting: *Deleted

## 2023-08-25 NOTE — Telephone Encounter (Signed)
Attempted to call patient for their post-procedure follow-up call. No answer. Left voicemail.

## 2023-08-27 LAB — SURGICAL PATHOLOGY

## 2023-09-01 ENCOUNTER — Encounter: Payer: Self-pay | Admitting: Internal Medicine

## 2024-01-10 DIAGNOSIS — Z13220 Encounter for screening for lipoid disorders: Secondary | ICD-10-CM | POA: Diagnosis not present

## 2024-01-10 DIAGNOSIS — Z0000 Encounter for general adult medical examination without abnormal findings: Secondary | ICD-10-CM | POA: Diagnosis not present

## 2024-01-19 DIAGNOSIS — H6123 Impacted cerumen, bilateral: Secondary | ICD-10-CM | POA: Diagnosis not present

## 2024-01-19 DIAGNOSIS — Z01411 Encounter for gynecological examination (general) (routine) with abnormal findings: Secondary | ICD-10-CM | POA: Diagnosis not present

## 2024-01-19 DIAGNOSIS — Z23 Encounter for immunization: Secondary | ICD-10-CM | POA: Diagnosis not present

## 2024-01-19 DIAGNOSIS — Z0000 Encounter for general adult medical examination without abnormal findings: Secondary | ICD-10-CM | POA: Diagnosis not present

## 2024-01-19 DIAGNOSIS — N952 Postmenopausal atrophic vaginitis: Secondary | ICD-10-CM | POA: Diagnosis not present

## 2024-01-19 DIAGNOSIS — Z01419 Encounter for gynecological examination (general) (routine) without abnormal findings: Secondary | ICD-10-CM | POA: Diagnosis not present

## 2024-01-24 DIAGNOSIS — H6123 Impacted cerumen, bilateral: Secondary | ICD-10-CM | POA: Diagnosis not present

## 2024-01-24 DIAGNOSIS — H900 Conductive hearing loss, bilateral: Secondary | ICD-10-CM | POA: Diagnosis not present

## 2024-05-02 ENCOUNTER — Other Ambulatory Visit: Payer: Self-pay | Admitting: Family Medicine

## 2024-05-02 DIAGNOSIS — Z1231 Encounter for screening mammogram for malignant neoplasm of breast: Secondary | ICD-10-CM

## 2024-05-16 ENCOUNTER — Ambulatory Visit
Admission: RE | Admit: 2024-05-16 | Discharge: 2024-05-16 | Disposition: A | Discharge status: Home / Self Care | Source: Ambulatory Visit | Attending: Family Medicine | Admitting: Family Medicine

## 2024-05-16 DIAGNOSIS — Z1231 Encounter for screening mammogram for malignant neoplasm of breast: Secondary | ICD-10-CM

## 2024-05-17 ENCOUNTER — Ambulatory Visit

## 2024-07-03 DIAGNOSIS — E785 Hyperlipidemia, unspecified: Secondary | ICD-10-CM | POA: Diagnosis not present

## 2024-07-03 DIAGNOSIS — E559 Vitamin D deficiency, unspecified: Secondary | ICD-10-CM | POA: Diagnosis not present

## 2024-07-03 DIAGNOSIS — R5383 Other fatigue: Secondary | ICD-10-CM | POA: Diagnosis not present

## 2024-07-10 DIAGNOSIS — E782 Mixed hyperlipidemia: Secondary | ICD-10-CM | POA: Diagnosis not present

## 2024-07-10 DIAGNOSIS — E039 Hypothyroidism, unspecified: Secondary | ICD-10-CM | POA: Diagnosis not present

## 2024-07-10 DIAGNOSIS — Z789 Other specified health status: Secondary | ICD-10-CM | POA: Diagnosis not present

## 2024-07-10 DIAGNOSIS — Z7185 Encounter for immunization safety counseling: Secondary | ICD-10-CM | POA: Diagnosis not present

## 2024-07-10 DIAGNOSIS — E559 Vitamin D deficiency, unspecified: Secondary | ICD-10-CM | POA: Diagnosis not present

## 2024-07-10 DIAGNOSIS — Z23 Encounter for immunization: Secondary | ICD-10-CM | POA: Diagnosis not present
